# Patient Record
Sex: Female | Born: 1945 | Race: White | Hispanic: No | Marital: Married | Smoking: Former smoker
Health system: Southern US, Community
[De-identification: ages and names within clinical notes are randomized; demographics above are authoritative.]

## PROBLEM LIST (undated history)

## (undated) DIAGNOSIS — I10 Essential (primary) hypertension: Secondary | ICD-10-CM

## (undated) DIAGNOSIS — F0390 Unspecified dementia without behavioral disturbance: Secondary | ICD-10-CM

## (undated) HISTORY — PX: CATARACT EXTRACTION: SUR2

## (undated) HISTORY — PX: ABDOMINAL HYSTERECTOMY: SHX81

---

## 2000-09-03 ENCOUNTER — Emergency Department (HOSPITAL_COMMUNITY): Admission: EM | Admit: 2000-09-03 | Discharge: 2000-09-03 | Payer: Self-pay | Admitting: Internal Medicine

## 2000-09-03 ENCOUNTER — Encounter: Payer: Self-pay | Admitting: Internal Medicine

## 2001-01-30 ENCOUNTER — Encounter: Admission: RE | Admit: 2001-01-30 | Discharge: 2001-01-30 | Payer: Self-pay | Admitting: Family Medicine

## 2001-01-30 ENCOUNTER — Encounter: Payer: Self-pay | Admitting: Family Medicine

## 2001-02-11 ENCOUNTER — Encounter: Payer: Self-pay | Admitting: Family Medicine

## 2001-02-11 ENCOUNTER — Encounter: Admission: RE | Admit: 2001-02-11 | Discharge: 2001-02-11 | Payer: Self-pay | Admitting: Family Medicine

## 2002-02-26 ENCOUNTER — Ambulatory Visit (HOSPITAL_BASED_OUTPATIENT_CLINIC_OR_DEPARTMENT_OTHER): Admission: RE | Admit: 2002-02-26 | Discharge: 2002-02-26 | Payer: Self-pay | Admitting: Orthopedic Surgery

## 2003-04-20 ENCOUNTER — Other Ambulatory Visit: Admission: RE | Admit: 2003-04-20 | Discharge: 2003-04-20 | Payer: Self-pay | Admitting: Family Medicine

## 2003-12-16 ENCOUNTER — Ambulatory Visit (HOSPITAL_COMMUNITY): Admission: RE | Admit: 2003-12-16 | Discharge: 2003-12-16 | Payer: Self-pay | Admitting: *Deleted

## 2003-12-16 ENCOUNTER — Ambulatory Visit (HOSPITAL_BASED_OUTPATIENT_CLINIC_OR_DEPARTMENT_OTHER): Admission: RE | Admit: 2003-12-16 | Discharge: 2003-12-16 | Payer: Self-pay | Admitting: *Deleted

## 2005-12-01 ENCOUNTER — Other Ambulatory Visit: Admission: RE | Admit: 2005-12-01 | Discharge: 2005-12-01 | Payer: Self-pay | Admitting: Family Medicine

## 2006-02-23 ENCOUNTER — Ambulatory Visit (HOSPITAL_COMMUNITY): Admission: RE | Admit: 2006-02-23 | Discharge: 2006-02-23 | Payer: Self-pay | Admitting: Gastroenterology

## 2008-03-04 ENCOUNTER — Other Ambulatory Visit: Admission: RE | Admit: 2008-03-04 | Discharge: 2008-03-04 | Payer: Self-pay | Admitting: Family Medicine

## 2008-04-16 ENCOUNTER — Encounter: Admission: RE | Admit: 2008-04-16 | Discharge: 2008-04-16 | Payer: Self-pay | Admitting: Family Medicine

## 2011-03-31 NOTE — Op Note (Signed)
NAMEBILAN, Gilbert                      ACCOUNT NO.:  [id number]   MEDICAL RECORD NO.:  [id number]                   PATIENT TYPE:  AMB   LOCATION:  DSC                                  FACILITY:  [facility]   PHYSICIAN:  Lowell Bouton, M.D.      DATE OF BIRTH:  [birth year]   DATE OF PROCEDURE:  12/16/2003  DATE OF DISCHARGE:                                 OPERATIVE REPORT   PREOPERATIVE DIAGNOSIS:  Trigger finger right middle finger and left ring  finger.   POSTOPERATIVE DIAGNOSIS:  Trigger finger right middle finger and left ring  finger.   PROCEDURE:  Release of A1 pulley right middle finger and left ring finger.   SURGEON:  Lowell Bouton, M.D.   ANESTHESIA:  0.5% Marcaine local with sedation.   FINDINGS:  The patient had thickening of the tenosynovium around the tendon  in both digits. There was nodular formation beneath the A1 pulley.   DESCRIPTION OF PROCEDURE:  Under 0.5% Marcaine local anesthesia with a  tourniquet on both forearms, both hands were prepped and draped in the usual  sterile fashion and after elevating the limb, the right tourniquet was  inflated to 250 mmHg.  An oblique incision was made in line with the distal  palmar crease overlying the middle finger.  Blunt dissection was carried  through the subcutaneous tissues and bleeding points were coagulated.  Blunt  dissection was carried down to the flexor tendon sheath taking care to  protect the digital nerves.  The A1 pulley was incised with a knife and then  released completely with the scissors.  The finger was flexed fully and no  further locking or triggering was noted.  The wound was irrigated with  saline.  The skin was closed with 4-0 nylon sutures.  Sterile dressings were  applied and the tourniquet was released.  The left hand was then elevated  and it was noted that the tourniquet had been inflated at the time of the  opposite one.  It was released and then reinflated.   The incision was made  in line with the distal palmar crease overlying the ring finger.  There was  a fair amount of scar tissue in the adjacent digit where the previous middle  finger had been released.  Blunt dissection was carried down to the flexor  tendon sheath and some palmar fascia was excised.  The A1 pulley was  identified and care was taken to protect the digital nerves.  The A1 pulley  was incised with a knife and then released completely with the scissors.  The finger was fully flexed and no further locking or triggering was noted.  The wound was irrigated with saline and the skin was closed with 4-0 nylon  sutures.  Sterile dressings were applied and the tourniquet released with  good circulation to the hand. The patient went to the recovery room awake,  stable, and in good condition.  Lowell Bouton, M.D.    EMM/MEDQ  D:  12/16/2003  T:  12/16/2003  Job:  [id number]

## 2011-03-31 NOTE — Op Note (Signed)
[place]  Patient:    ROCIO, [name] Visit Number: [id number] MRN: [id number]          Service Type: DSU Location: [facility] [place] Attending Physician:  Marlowe Shores Dictated by:   Artist Pais Mina Marble, M.D. Proc. Date: 02/26/02 Admit Date:  02/26/2002                             Operative Report  PREOPERATIVE DIAGNOSIS: Left thumb and left long and ring finger stenosing tenosynovitis.  POSTOPERATIVE DIAGNOSIS: Left thumb and left long and ring finger stenosing tenosynovitis.  OPERATION/PROCEDURE:  1. Left thumb A1 pulley release.  2. Left long A1 pulley release.  3. Inject A1 pulled, left ring.  SURGEON: Artist Pais. Mina Marble, M.D.  ASSISTANT: Junius Roads. Ireton, P.A.C.  ANESTHESIA: Bier block.  TOURNIQUET TIME: Thirty minutes.  COMPLICATIONS: None.  DRAINS: None.  OPERATIVE REPORT: The patient was taken to the operating room and after induction of adequate Bier block analgesia the left upper extremity was prepped and draped in the usual sterile fashion.  Once this was done a 2 cm incision was made in the MP flexion crease of the thumb on the left.  The incision was taken down through the skin and subcutaneous tissue, with care to carefully identify and retract the neurovascular bundles.  The A1 pulley was identified and split, thus freeing up the FPL tendon.  The FPL tendon was lysed of all adhesions.  This wound was irrigated.  The same incision was made over the A1 pulley area of the long finger.  The same operation was performed. This wound was also irrigated.  These were both closed with running 3-0 Prolene subcuticular stitch under sterile conditions.  The ring finger A1 pulley area was injected with Kenalog-40 1 cc and 0.25% plain Marcaine 1 cc. The patient was then placed in a sterile dressing of Xeroform, 4 x 4s, fluffs, and a compressive hand dressing.  The patient tolerated the procedure well and went to the recovery  room in stable fashion. Dictated by:   Artist Pais Mina Marble, M.D. Attending Physician:  Marlowe Shores DD:  02/26/02 TD:  02/27/02 Job: [id number]

## 2011-03-31 NOTE — Op Note (Signed)
Gilbert, Cynthia               ACCOUNT NO.:  [id number]   MEDICAL RECORD NO.:  [id number]          PATIENT TYPE:  AMB   LOCATION:  ENDO                         FACILITY:  [facility]   PHYSICIAN:  Petra Kuba, M.D.    DATE OF BIRTH:  1946/07/21   DATE OF PROCEDURE:  02/23/2006  DATE OF DISCHARGE:  02/23/2006                                 OPERATIVE REPORT   PROCEDURE:  Capsule endoscopy.   INDICATIONS:  Guaiac positive anemia, nondiagnostic colon and endoscopy.   Consent was signed after risks, benefits, methods, options thoroughly  discussed with Cynthia Gilbert by Dr. Bosie Clos.   PROCEDURE:  The capsule endoscopy was done in the customary fashion on February 23, 2006 by the endoscopy staff, and Dr. Bosie Clos has asked me to review the  pictures and the report.   FINDINGS:  Entire normal capsule endoscopy without any signs of GI bleeding.   PLAN:  Further workup and plans per Dr. Bosie Clos with patient followup to  recheck CBCs, guaiacs and see if any further workup plans are needed.           ______________________________  Petra Kuba, M.D.     MEM/MEDQ  D:  03/14/2006  T:  03/14/2006  Job:  [id number]   cc:   Shirley Friar, MD  Fax: [phone]

## 2011-04-03 ENCOUNTER — Other Ambulatory Visit: Payer: Self-pay | Admitting: Family Medicine

## 2011-04-03 DIAGNOSIS — R0989 Other specified symptoms and signs involving the circulatory and respiratory systems: Secondary | ICD-10-CM

## 2011-04-04 ENCOUNTER — Ambulatory Visit
Admission: RE | Admit: 2011-04-04 | Discharge: 2011-04-04 | Disposition: A | Discharge status: Home / Self Care | Payer: Medicare Other | Source: Ambulatory Visit | Attending: Family Medicine | Admitting: Family Medicine

## 2011-04-04 DIAGNOSIS — R0989 Other specified symptoms and signs involving the circulatory and respiratory systems: Secondary | ICD-10-CM

## 2011-04-05 ENCOUNTER — Other Ambulatory Visit: Payer: Self-pay | Admitting: Family Medicine

## 2011-04-05 DIAGNOSIS — R4182 Altered mental status, unspecified: Secondary | ICD-10-CM

## 2011-04-06 ENCOUNTER — Other Ambulatory Visit: Payer: Medicare Other

## 2012-03-06 IMAGING — US US ABDOMEN COMPLETE
1 series · 14 of 25 positions shown · non-contrast
Comparison: None.

CLINICAL DATA: Abdominal bruit.

COMPLETE ABDOMINAL ULTRASOUND

[Series 1: us abdomen complete · 0.21mm/px · 14 of 63 slices shown]
[im 1/63]
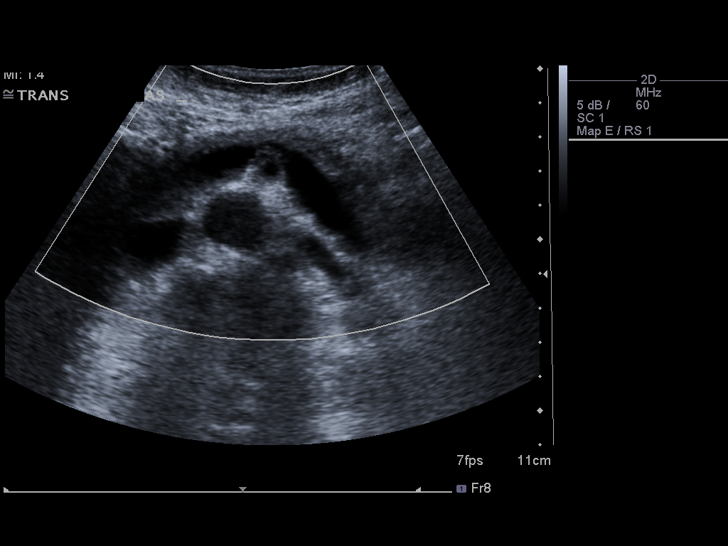
[im 6/63]
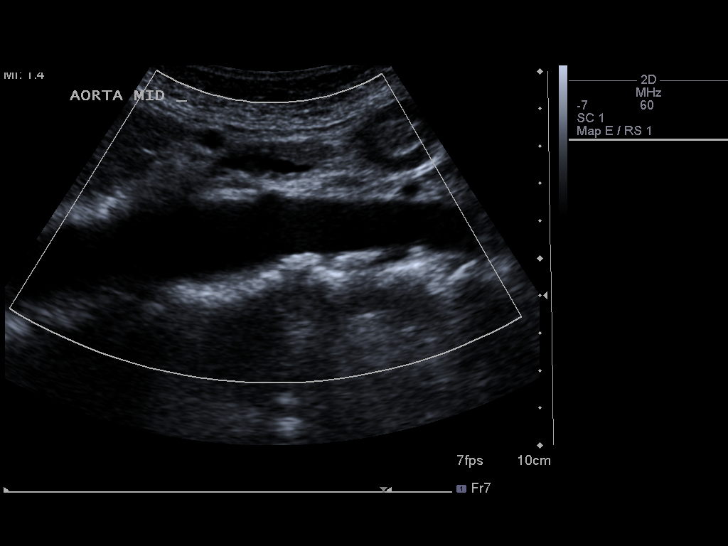
[im 11/63]
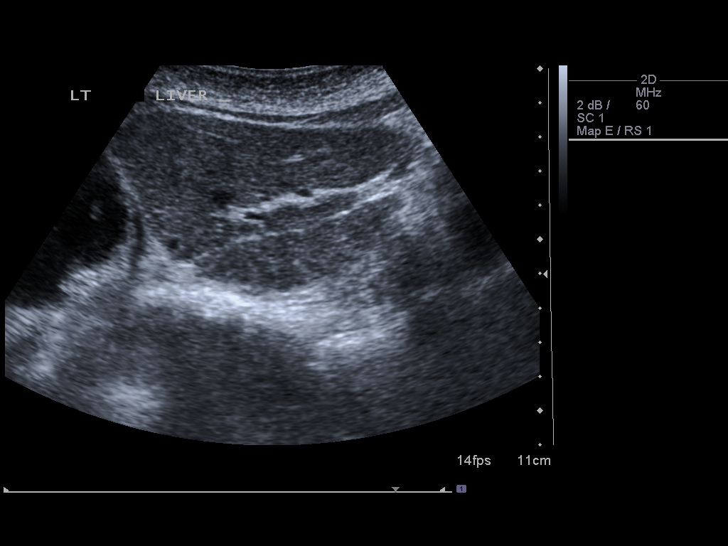
[im 16/63]
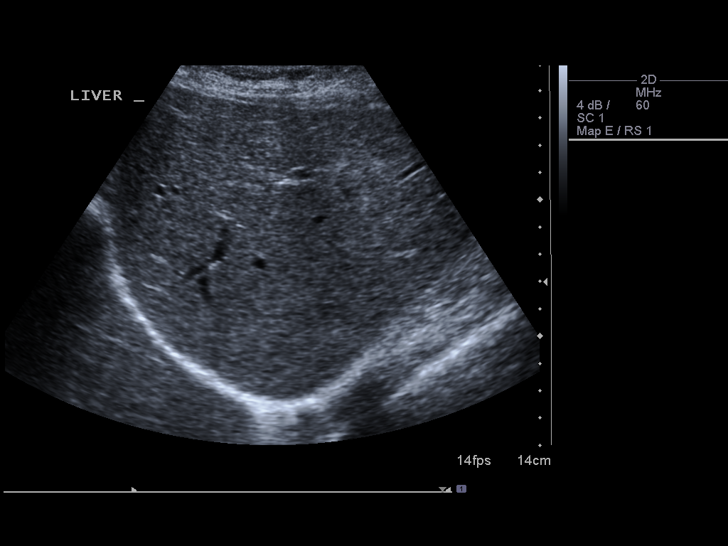
[im 21/63]
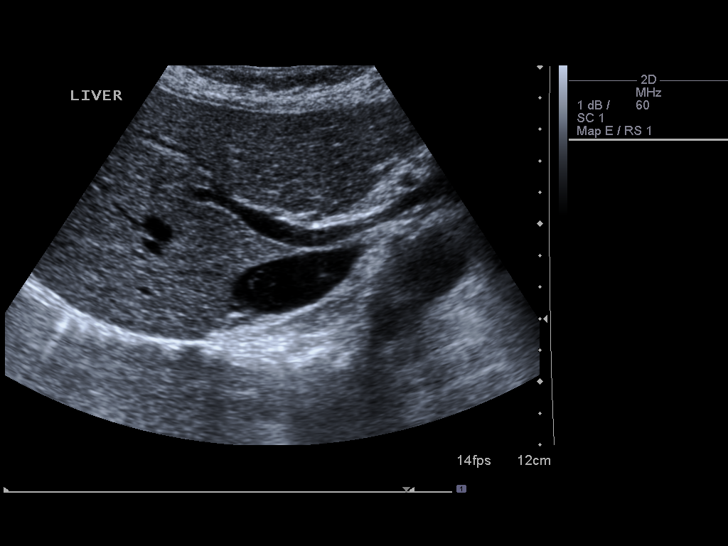
[im 24/63]
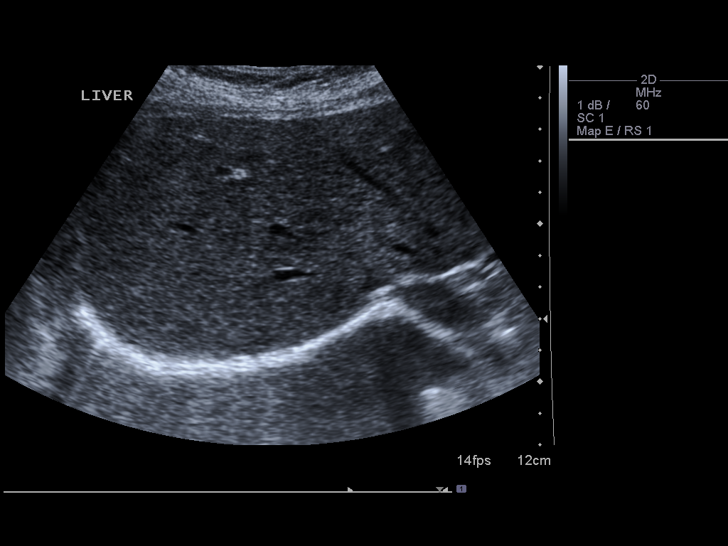
[im 29/63]
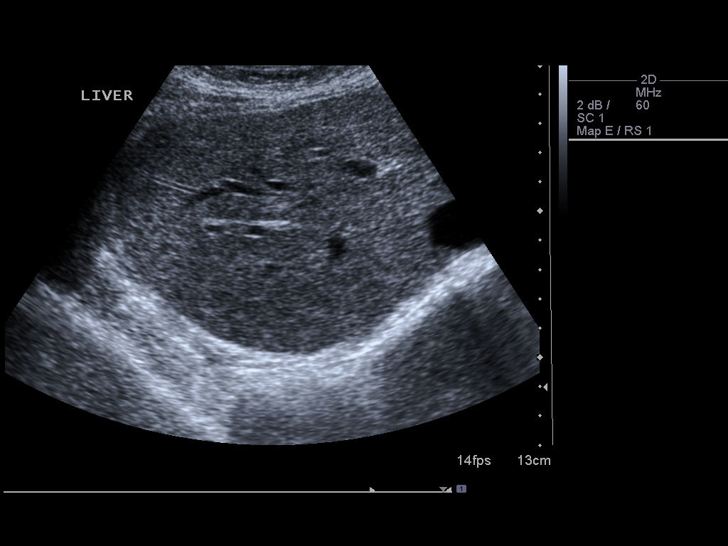
[im 34/63]
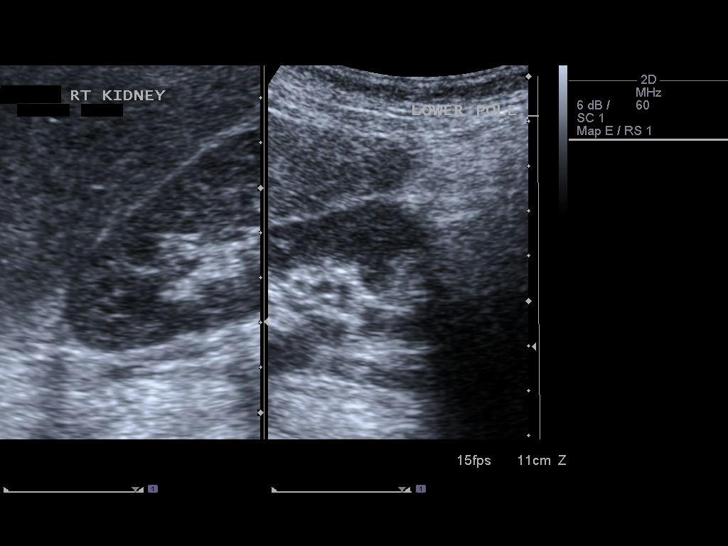
[im 39/63]
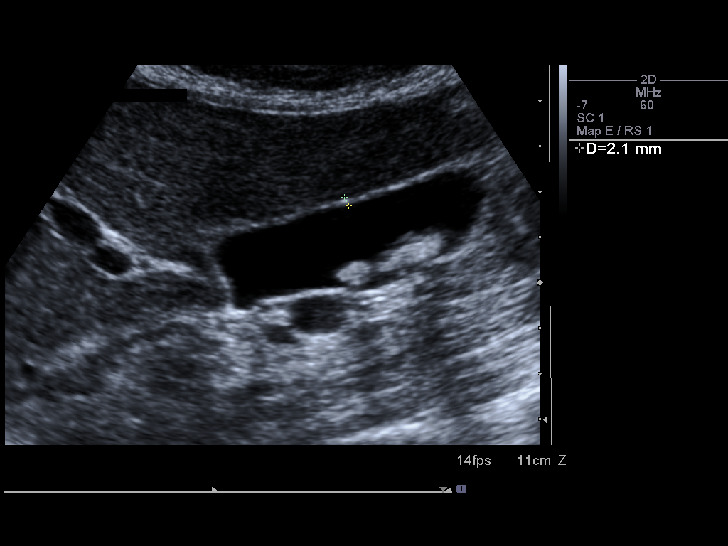
[im 42/63]
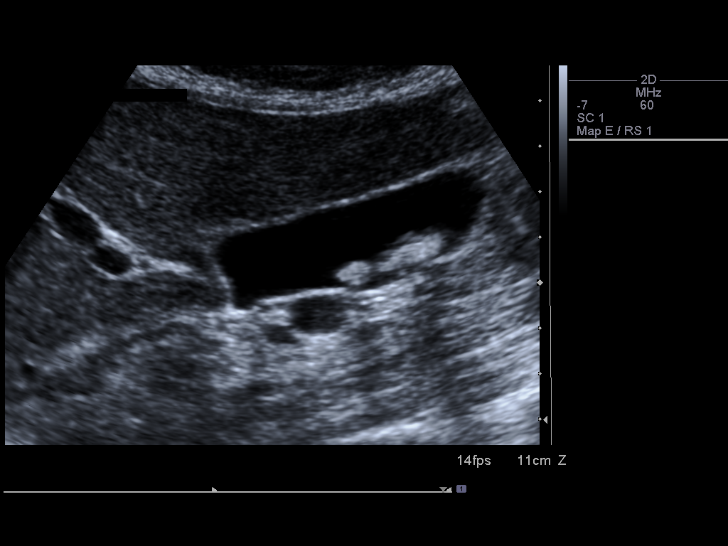
[im 47/63]
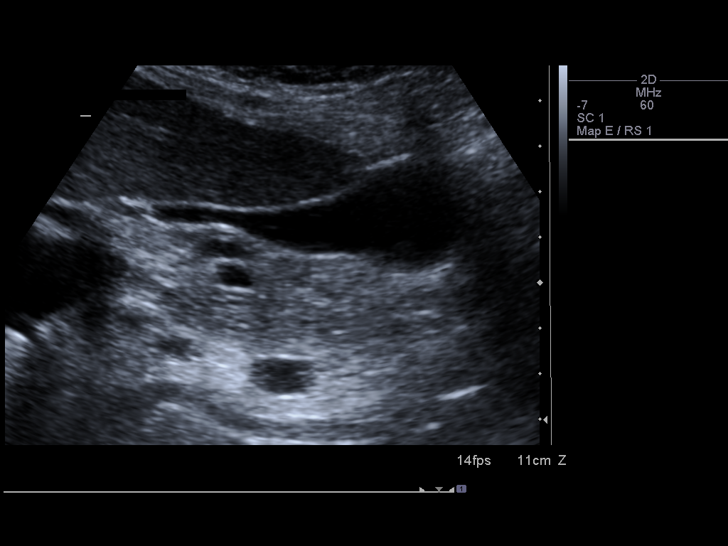
[im 52/63]
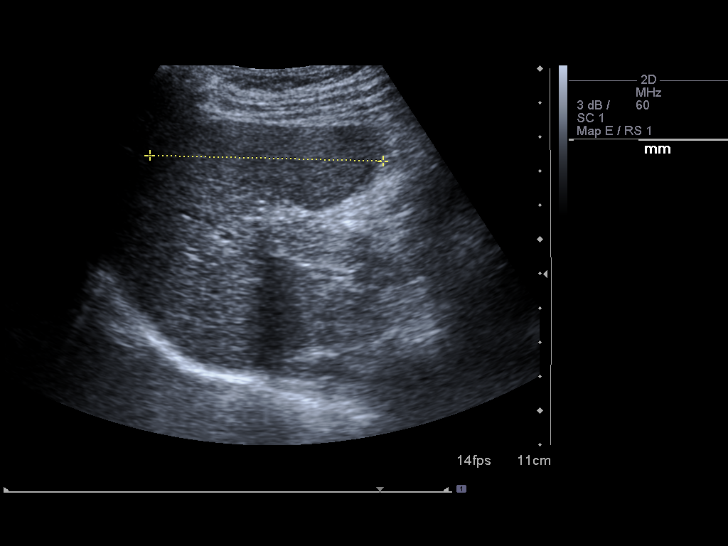
[im 57/63]
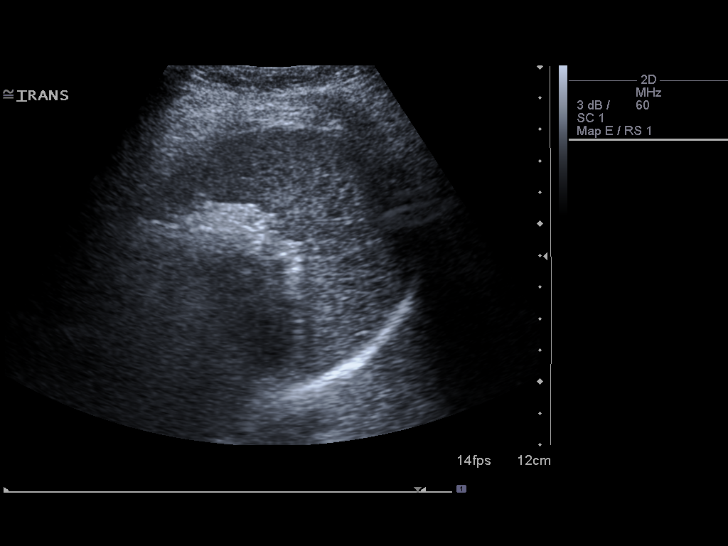
[im 63/63]
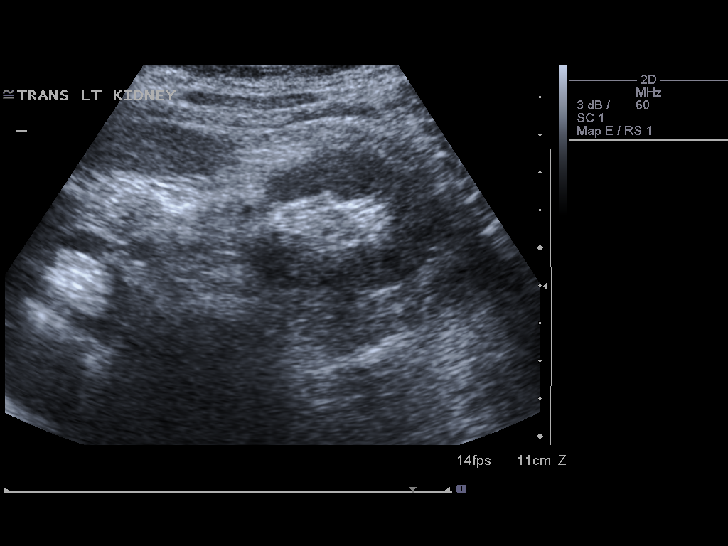

[14 of 25 positions shown; findings below may reference images not displayed]

FINDINGS: Gallbladder:  2 gallstones are seen within the gallbladder.  The
largest measures 1.6 cm.  No wall thickening or sonographic
Kai Cheong.

Common bile duct:   Normal caliber, 2 mm.

Liver:  No focal lesion identified.  Within normal limits in
parenchymal echogenicity.

IVC:  Appears normal.

Pancreas:  No focal abnormality seen.

Spleen:  Within normal limits in size and echotexture.

Right Kidney:   Normal in size and parenchymal echogenicity.  No
evidence of mass or hydronephrosis.

Left Kidney:  Normal in size and parenchymal echogenicity.  No
evidence of mass or hydronephrosis.

Abdominal aorta:  Maximum aortic diameter is 2.7 cm proximally,
tapering to 1.6 cm distally.  No evidence of aneurysm.  Mild
atherosclerotic irregularity.
IMPRESSION: Cholelithiasis.  No sonographic evidence of acute cholecystitis.

No evidence of abdominal aortic aneurysm.

## 2017-09-01 ENCOUNTER — Inpatient Hospital Stay (HOSPITAL_COMMUNITY): Payer: Medicare Other

## 2017-09-01 ENCOUNTER — Inpatient Hospital Stay (HOSPITAL_COMMUNITY)
Admission: AD | Admit: 2017-09-01 | Discharge: 2017-09-05 | DRG: 593 | Disposition: A | Discharge status: Hospice | Payer: Medicare Other | Attending: Internal Medicine | Admitting: Internal Medicine

## 2017-09-01 ENCOUNTER — Encounter (HOSPITAL_COMMUNITY): Payer: Self-pay | Admitting: Internal Medicine

## 2017-09-01 DIAGNOSIS — Z23 Encounter for immunization: Secondary | ICD-10-CM | POA: Diagnosis not present

## 2017-09-01 DIAGNOSIS — Z66 Do not resuscitate: Secondary | ICD-10-CM | POA: Diagnosis not present

## 2017-09-01 DIAGNOSIS — Z8249 Family history of ischemic heart disease and other diseases of the circulatory system: Secondary | ICD-10-CM | POA: Diagnosis not present

## 2017-09-01 DIAGNOSIS — Z515 Encounter for palliative care: Secondary | ICD-10-CM | POA: Diagnosis present

## 2017-09-01 DIAGNOSIS — I10 Essential (primary) hypertension: Secondary | ICD-10-CM | POA: Diagnosis present

## 2017-09-01 DIAGNOSIS — Z90710 Acquired absence of both cervix and uterus: Secondary | ICD-10-CM

## 2017-09-01 DIAGNOSIS — Z87891 Personal history of nicotine dependence: Secondary | ICD-10-CM | POA: Diagnosis not present

## 2017-09-01 DIAGNOSIS — F0390 Unspecified dementia without behavioral disturbance: Secondary | ICD-10-CM | POA: Diagnosis present

## 2017-09-01 DIAGNOSIS — L89620 Pressure ulcer of left heel, unstageable: Secondary | ICD-10-CM | POA: Diagnosis present

## 2017-09-01 DIAGNOSIS — L03116 Cellulitis of left lower limb: Secondary | ICD-10-CM | POA: Diagnosis present

## 2017-09-01 DIAGNOSIS — Z7401 Bed confinement status: Secondary | ICD-10-CM | POA: Diagnosis not present

## 2017-09-01 DIAGNOSIS — L0390 Cellulitis, unspecified: Secondary | ICD-10-CM

## 2017-09-01 DIAGNOSIS — R739 Hyperglycemia, unspecified: Secondary | ICD-10-CM | POA: Diagnosis not present

## 2017-09-01 DIAGNOSIS — L98499 Non-pressure chronic ulcer of skin of other sites with unspecified severity: Secondary | ICD-10-CM

## 2017-09-01 DIAGNOSIS — Z7189 Other specified counseling: Secondary | ICD-10-CM

## 2017-09-01 DIAGNOSIS — Z880 Allergy status to penicillin: Secondary | ICD-10-CM | POA: Diagnosis not present

## 2017-09-01 HISTORY — DX: Essential (primary) hypertension: I10

## 2017-09-01 HISTORY — DX: Unspecified dementia, unspecified severity, without behavioral disturbance, psychotic disturbance, mood disturbance, and anxiety: F03.90

## 2017-09-01 LAB — CBC
HEMATOCRIT: 45.3 % (ref 36.0–46.0)
Hemoglobin: 14.4 g/dL (ref 12.0–15.0)
MCH: 27.7 pg (ref 26.0–34.0)
MCHC: 31.8 g/dL (ref 30.0–36.0)
MCV: 87.1 fL (ref 78.0–100.0)
Platelets: 243 10*3/uL (ref 150–400)
RBC: 5.2 MIL/uL — ABNORMAL HIGH (ref 3.87–5.11)
RDW: 13.7 % (ref 11.5–15.5)
WBC: 9.2 10*3/uL (ref 4.0–10.5)

## 2017-09-01 LAB — COMPREHENSIVE METABOLIC PANEL
ALBUMIN: 3.7 g/dL (ref 3.5–5.0)
ALT: 21 U/L (ref 14–54)
AST: 22 U/L (ref 15–41)
Alkaline Phosphatase: 140 U/L — ABNORMAL HIGH (ref 38–126)
Anion gap: 11 (ref 5–15)
BILIRUBIN TOTAL: 0.5 mg/dL (ref 0.3–1.2)
BUN: 17 mg/dL (ref 6–20)
CHLORIDE: 110 mmol/L (ref 101–111)
CO2: 24 mmol/L (ref 22–32)
Calcium: 9.9 mg/dL (ref 8.9–10.3)
Creatinine, Ser: 0.94 mg/dL (ref 0.44–1.00)
GFR calc Af Amer: >60 mL/min (ref >60)
GFR calc non Af Amer: 60 mL/min — ABNORMAL LOW (ref >60)
GLUCOSE: 106 mg/dL — AB (ref 65–99)
POTASSIUM: 3.8 mmol/L (ref 3.5–5.1)
Sodium: 145 mmol/L (ref 135–145)
TOTAL PROTEIN: 7 g/dL (ref 6.5–8.1)

## 2017-09-01 MED ORDER — QUETIAPINE FUMARATE 25 MG PO TABS: 50.0000 mg | ORAL_TABLET | Freq: Every day | ORAL | Status: DC

## 2017-09-01 MED ORDER — SODIUM CHLORIDE 0.9% FLUSH: 3.0000 mL | INTRAVENOUS | Status: DC | PRN

## 2017-09-01 MED ORDER — SODIUM CHLORIDE 0.9 % IV SOLN: 250.0000 mL | INTRAVENOUS | Status: DC | PRN

## 2017-09-01 MED ORDER — SODIUM CHLORIDE 0.9% FLUSH
3.0000 mL | Freq: Two times a day (BID) | INTRAVENOUS | Status: DC
  Administered 2017-09-01 – 2017-09-04 (×4): 3 mL via INTRAVENOUS

## 2017-09-01 MED ORDER — VANCOMYCIN HCL 10 G IV SOLR
1250.0000 mg | INTRAVENOUS | Status: DC
  Administered 2017-09-01: 1250 mg via INTRAVENOUS
  Filled 2017-09-01: qty 1250

## 2017-09-01 MED ORDER — QUETIAPINE FUMARATE 25 MG PO TABS
50.0000 mg | ORAL_TABLET | Freq: Every day | ORAL | Status: DC
  Administered 2017-09-02 – 2017-09-05 (×4): 50 mg via ORAL
  Filled 2017-09-01 (×4): qty 2

## 2017-09-01 MED ORDER — ACETAMINOPHEN 325 MG PO TABS: 650.0000 mg | ORAL_TABLET | Freq: Four times a day (QID) | ORAL | Status: DC | PRN

## 2017-09-01 MED ORDER — LEVOFLOXACIN IN D5W 500 MG/100ML IV SOLN
500.0000 mg | INTRAVENOUS | Status: DC
  Administered 2017-09-01 – 2017-09-04 (×4): 500 mg via INTRAVENOUS
  Filled 2017-09-01 (×4): qty 100

## 2017-09-01 MED ORDER — ENOXAPARIN SODIUM 40 MG/0.4ML ~~LOC~~ SOLN
40.0000 mg | SUBCUTANEOUS | Status: DC
  Administered 2017-09-02 – 2017-09-05 (×4): 40 mg via SUBCUTANEOUS
  Filled 2017-09-01 (×4): qty 0.4

## 2017-09-01 MED ORDER — QUETIAPINE FUMARATE 25 MG PO TABS
75.0000 mg | ORAL_TABLET | Freq: Every day | ORAL | Status: DC
  Administered 2017-09-02 – 2017-09-04 (×4): 75 mg via ORAL
  Filled 2017-09-01 (×4): qty 3

## 2017-09-01 MED ORDER — MEMANTINE HCL 10 MG PO TABS
5.0000 mg | ORAL_TABLET | Freq: Two times a day (BID) | ORAL | Status: DC
  Administered 2017-09-02 – 2017-09-05 (×8): 5 mg via ORAL
  Filled 2017-09-01 (×8): qty 1

## 2017-09-01 MED ORDER — SILVER SULFADIAZINE 1 % EX CREA
TOPICAL_CREAM | Freq: Every day | CUTANEOUS | Status: DC
  Administered 2017-09-01 – 2017-09-04 (×4): via TOPICAL
  Administered 2017-09-05: 1 via TOPICAL
  Filled 2017-09-01: qty 85

## 2017-09-01 MED ORDER — LOSARTAN POTASSIUM 50 MG PO TABS
25.0000 mg | ORAL_TABLET | Freq: Every day | ORAL | Status: DC
  Administered 2017-09-02 – 2017-09-05 (×4): 25 mg via ORAL
  Filled 2017-09-01 (×4): qty 1

## 2017-09-01 MED ORDER — ACETAMINOPHEN 650 MG RE SUPP
650.0000 mg | Freq: Four times a day (QID) | RECTAL | Status: DC | PRN
  Administered 2017-09-01: 650 mg via RECTAL
  Filled 2017-09-01: qty 1

## 2017-09-01 MED ORDER — QUETIAPINE FUMARATE 25 MG PO TABS
50.0000 mg | ORAL_TABLET | Freq: Every day | ORAL | Status: DC
  Administered 2017-09-02 – 2017-09-05 (×4): 50 mg via ORAL
  Filled 2017-09-01 (×5): qty 2

## 2017-09-01 MED ORDER — INFLUENZA VAC SPLIT HIGH-DOSE 0.5 ML IM SUSY
0.5000 mL | PREFILLED_SYRINGE | INTRAMUSCULAR | Status: AC
  Administered 2017-09-02: 0.5 mL via INTRAMUSCULAR
  Filled 2017-09-01: qty 0.5

## 2017-09-01 NOTE — Progress Notes (Signed)
Pt admitted to floor 1840. MD at bedside. No orders yet. But MD to put in orders. Per family patient is mainly nonverbal due to advanced dementia. MD at desk placing orders and writing note at this time

## 2017-09-01 NOTE — Progress Notes (Signed)
Pharmacy Antibiotic Note  Cynthia RhymesLeslie J Sugg is a 71 y.o. female admitted on 09/01/2017 with heel infection.  Pharmacy has been consulted for Vancomycin / Levaquin dosing.  With advanced dementia, Scr stable  Plan: Levaquin 500 mg iv Q 24 hours Vancomycin 1250 mg iv Q 24  Height: 5\' 5"  (165.1 cm) Weight: 151 lb 0.2 oz (68.5 kg) IBW/kg (Calculated) : 57  No data recorded.   Recent Labs Lab 09/01/17 1918  WBC 9.2  CREATININE 0.94    Estimated Creatinine Clearance: 53.4 mL/min (by C-G formula based on SCr of 0.94 mg/dL).    Allergies  Allergen Reactions  . Amoxicillin Anaphylaxis     Thank you for allowing pharmacy to be a part of this patient's care. Okey RegalLisa Sowmya Partridge, PharmD [phone] 09/01/2017 8:23 PM

## 2017-09-01 NOTE — H&P (Signed)
H&P   Patient Demographics:    Cynthia Gilbert, is a 71 y.o. female  MRN: [id number]   DOB - 1946-01-26  Admit Date - 09/01/2017  Outpatient Primary MD for the patient is No primary care provider on file.  Referring MD/NP/PA:   Outpatient Specialists:   Patient coming from: home  No chief complaint on file. skin ulcer, cellulitis    HPI:    Cynthia Gilbert  is a 71 y.o. female, w end stage dementia apparently has skin ulcer on the left heel, husband apparently noticed this the other day and she was started on bactrim by her pcp.  She was unable to take the medicine due to her dementia and there was a foul smelling odor coming from her heel today.  There was redness extending from the skin ulcer up her heel towards her ankle.  Pt denies fever, chills, cp, palp, cough, sob.  There is slight swelling of the left foot in comparison to the right.  Pt will be admitted for skin ulcer and cellulitis.      Review of systems:    In addition to the HPI above,  No Fever-chills, No Headache, No changes with Vision or hearing, No problems swallowing food or Liquids, No Chest pain, Cough or Shortness of Breath, No Abdominal pain, No Nausea or Vommitting, Bowel movements are regular, No Blood in stool or Urine, No dysuria, No new joints pains-aches,  No new weakness, tingling, numbness in any extremity, No recent weight gain or loss, No polyuria, polydypsia or polyphagia, No significant Mental Stressors.  A full 10 point Review of Systems was done, except as stated above, all other Review of Systems were negative.   With Past History of the following :    Past Medical History:  Diagnosis Date  . Dementia   . Hypertension       Past Surgical History:  Procedure Laterality Date  . ABDOMINAL HYSTERECTOMY    . CATARACT EXTRACTION        Social History:     Social History   Substance Use Topics  . Smoking status: Former Smoker    Packs/day: 1.00    Years: 25.00    Types: Cigarettes    Quit date: 11/13/2008  . Smokeless tobacco: Never Used  . Alcohol use No     Lives - at home  Mobility -  Walks w assistance   Family History :     Family History  Problem Relation Age of Onset  . COPD Mother   . Heart failure Father       Home Medications:   Prior to Admission medications   Not on File     Allergies:     Allergies  Allergen Reactions  . Amoxicillin Anaphylaxis     Physical Exam:   Vitals  There were no vitals taken for this visit.   1. General  lying in bed in NAD,    2.  Normal affect and insight, Not Suicidal or Homicidal, Awake Alert, Oriented X 1.  3. No F.N deficits, ALL C.Nerves Intact, Strength 5/5 all 4 extremities, Sensation intact all 4 extremities, Plantars down going.  4. Ears and Eyes appear Normal, Conjunctivae clear, PERRLA. Moist Oral Mucosa.  5. Supple Neck, No JVD, No cervical lymphadenopathy appriciated, No Carotid Bruits.  6. Symmetrical Chest wall movement, Good air movement bilaterally, CTAB.  7. RRR, No Gallops, Rubs or Murmurs, No Parasternal Heave.  8. Positive Bowel Sounds, Abdomen Soft, No tenderness, No organomegaly appriciated,No rebound -guarding or rigidity.  9.  No Cyanosis, slight swelling of the left foot 2.5x3cm oval skin ulcer on the bottom to medial aspect of the left heel, slight surround redness, and erythema extending toward ankle.  Slight redness over the dorsum of the left foot.   10. Good muscle tone,  joints appear normal , no effusions, Normal ROM.  11. No Palpable Lymph Nodes in Neck or Axillae     Data Review:    CBC No results for input(s): WBC, HGB, HCT, PLT, MCV, MCH, MCHC, RDW, LYMPHSABS, MONOABS, EOSABS, BASOSABS, BANDABS in the last 168 hours.  Invalid input(s): NEUTRABS,  BANDSABD ------------------------------------------------------------------------------------------------------------------  Chemistries  No results for input(s): NA, K, CL, CO2, GLUCOSE, BUN, CREATININE, CALCIUM, MG, AST, ALT, ALKPHOS, BILITOT in the last 168 hours.  Invalid input(s): GFRCGP ------------------------------------------------------------------------------------------------------------------ CrCl cannot be calculated (No order found.). ------------------------------------------------------------------------------------------------------------------ No results for input(s): TSH, T4TOTAL, T3FREE, THYROIDAB in the last 72 hours.  Invalid input(s): FREET3  Coagulation profile No results for input(s): INR, PROTIME in the last 168 hours. ------------------------------------------------------------------------------------------------------------------- No results for input(s): DDIMER in the last 72 hours. -------------------------------------------------------------------------------------------------------------------  Cardiac Enzymes No results for input(s): CKMB, TROPONINI, MYOGLOBIN in the last 168 hours.  Invalid input(s): CK ------------------------------------------------------------------------------------------------------------------ No results found for: BNP   ---------------------------------------------------------------------------------------------------------------  Urinalysis No results found for: COLORURINE, APPEARANCEUR, LABSPEC, [place], GLUCOSEU, HGBUR, BILIRUBINUR, KETONESUR, PROTEINUR, UROBILINOGEN, NITRITE, LEUKOCYTESUR  ----------------------------------------------------------------------------------------------------------------   Imaging Results:    No results found.    Assessment & Plan:    Principal Problem:   Skin ulcer ([place]) Active Problems:   Dementia   Cellulitis    Skin ulcer/ celluilitis Wound culture Wound care  consult DC Bactrim Start empiric levaquin iv , and vanco iv pharmacy to dose Check cbc, cmp, esr, blood culture x2 Check left heel xray  Dementia Cont namenda  Hypertension Cont losartan  DVT Prophylaxis Lovenox - SCDs   AM Labs Ordered, also please review Full Orders  Family Communication: Admission, patients condition and plan of care including tests being ordered have been discussed with the patient  who indicate understanding and agree with the plan and Code Status.  Code Status FULL CODE  Likely DC to  home  Condition GUARDED    Consults called: none  Admission status: inpatient   Time spent in minutes : 45   Jani Gravel M.D on 09/01/2017 at 6:41 PM  Between 7am to 7am - Pager - [phone].

## 2017-09-02 DIAGNOSIS — R739 Hyperglycemia, unspecified: Secondary | ICD-10-CM

## 2017-09-02 DIAGNOSIS — I10 Essential (primary) hypertension: Secondary | ICD-10-CM

## 2017-09-02 LAB — SEDIMENTATION RATE: SED RATE: 17 mm/h (ref 0–22)

## 2017-09-02 LAB — GAMMA GT: GGT: 23 U/L (ref 7–50)

## 2017-09-02 MED ORDER — HYDRALAZINE HCL 20 MG/ML IJ SOLN: 5.0000 mg | Freq: Four times a day (QID) | INTRAMUSCULAR | Status: DC | PRN

## 2017-09-02 MED ORDER — DIPHENHYDRAMINE HCL 50 MG/ML IJ SOLN
25.0000 mg | Freq: Four times a day (QID) | INTRAMUSCULAR | Status: DC | PRN
  Administered 2017-09-02: 25 mg via INTRAVENOUS
  Filled 2017-09-02: qty 1

## 2017-09-02 MED ORDER — VANCOMYCIN HCL 10 G IV SOLR: 1250.0000 mg | INTRAVENOUS | Status: DC

## 2017-09-02 NOTE — Progress Notes (Signed)
MD notified pt had increasing redness in face, back extremely red and hot to touch with patient scratching abdomen. This is a change from initial assessment.  Patient did have first dose of vancomycin this evening.  MD ordered this nurse to stop vancomycin, and give benadryl IV.  Since administration of benadryl, the redness on her face has subsided, and her back has turned from bright red to pink in color.  Pharmacy made aware of occurrence.  Will continue to monitor patient.

## 2017-09-02 NOTE — Progress Notes (Signed)
Patient ID: Cynthia Gilbert, female   DOB: 09-Sep-1946, 71 y.o.   MRN: [id number]                                                                PROGRESS NOTE                                                                                                                                                                                                             Patient Demographics:    Cynthia Gilbert, is a 71 y.o. female, DOB - 1946/05/10, JYN:[id number]  Admit date - 09/01/2017   Admitting Physician Pearson Grippe, MD  Outpatient Primary MD for the patient is No primary care provider on file.  LOS - 1  Outpatient Specialists:    No chief complaint on file.   Cellulitis, skin ulcer   Brief Narrative   71 y.o. female, w end stage dementia apparently has skin ulcer on the left heel, husband apparently noticed this the other day and she was started on bactrim by her pcp.  She was unable to take the medicine due to her dementia and there was a foul smelling odor coming from her heel today.  There was redness extending from the skin ulcer up her heel towards her ankle.  Pt denies fever, chills, cp, palp, cough, sob.  There is slight swelling of the left foot in comparison to the right.  Pt will be admitted for skin ulcer and cellulitis.     Subjective:    Cynthia Gilbert today is lying in bed, no complaints  Skin ulcer is still red and angry.  Pt had allergic reaction to vanco last nite, ? Red man syndrome.   No headache, No chest pain, No abdominal pain - No Nausea, No new weakness tingling or numbness, No Cough - SOB.    Assessment  & Plan :    Principal Problem:   Skin ulcer ([facility]) Active Problems:   Dementia   Cellulitis   Hyperglycemia   Essential hypertension    Skin ulcer/ celluilitis Xray=> negative osteomyelitis Blood culture => ngtd Wound culture=> gram stain few gram positive cocci in pairs Wound care consult DC Bactrim 10/20 Levaquin iv 10/20=> vanco 10/20=> ? Red man  syndrome, STOPPED   Dementia Cont namenda  Hypertension Cont losartan Add  hydralazine 5mg  iv q6h prn sbp >160  Code Status :  FULL CODE  Family Communication  : w daughter  Disposition Plan  : TBD  Barriers For Discharge :   Consults  :  Wound care  Procedures  :   DVT Prophylaxis  :  Lovenox - SCDs   Lab Results  Component Value Date   PLT 243 09/01/2017    Antibiotics  :  vanco 10/20, levaquin 10/20=>   Anti-infectives    Start     Dose/Rate Route Frequency Ordered Stop   09/01/17 2100  levofloxacin (LEVAQUIN) IVPB 500 mg     500 mg 100 mL/hr over 60 Minutes Intravenous Every 24 hours 09/01/17 2026     09/01/17 2100  vancomycin (VANCOCIN) 1,250 mg in sodium chloride 0.9 % 250 mL IVPB     1,250 mg 166.7 mL/hr over 90 Minutes Intravenous Every 24 hours 09/01/17 2026          Objective:   Vitals:   09/01/17 1900 09/01/17 2214 09/02/17 0500  BP:  138/69 (!) 153/75  Pulse:  80 72  Resp:  18 18  Temp:  98.2 F (36.8 C) (!) 97.5 F (36.4 C)  TempSrc:  Oral Oral  SpO2:  97% 97%  Weight: 68.5 kg (151 lb 0.2 oz)  69.4 kg (153 lb)  Height: 5\' 5"  (1.651 m)      Wt Readings from Last 3 Encounters:  09/02/17 69.4 kg (153 lb)    No intake or output data in the 24 hours ending 09/02/17 0731   Physical Exam  Awake Alert, Oriented X 3, No new F.N deficits, Normal affect [place].AT,PERRAL Supple Neck,No JVD, No cervical lymphadenopathy appriciated.  Symmetrical Chest wall movement, Good air movement bilaterally, CTAB RRR,No Gallops,Rubs or new Murmurs, No Parasternal Heave +ve B.Sounds, Abd Soft, No tenderness, No organomegaly appriciated, No rebound - guarding or rigidity. No Cyanosis, Clubbing   slight swelling of the left foot 2.5x3cm oval skin ulcer on the bottom to medial aspect of the left heel, slight surround redness, and erythema extending toward ankle.  Slight redness over the dorsum of the left foot.    Data Review:    CBC  Recent Labs Lab  09/01/17 1918  WBC 9.2  HGB 14.4  HCT 45.3  PLT 243  MCV 87.1  MCH 27.7  MCHC 31.8  RDW 13.7    Chemistries   Recent Labs Lab 09/01/17 1918  NA 145  K 3.8  CL 110  CO2 24  GLUCOSE 106*  BUN 17  CREATININE 0.94  CALCIUM 9.9  AST 22  ALT 21  ALKPHOS 140*  BILITOT 0.5   ------------------------------------------------------------------------------------------------------------------ No results for input(s): CHOL, HDL, LDLCALC, TRIG, CHOLHDL, LDLDIRECT in the last 72 hours.  No results found for: HGBA1C ------------------------------------------------------------------------------------------------------------------ No results for input(s): TSH, T4TOTAL, T3FREE, THYROIDAB in the last 72 hours.  Invalid input(s): FREET3 ------------------------------------------------------------------------------------------------------------------ No results for input(s): VITAMINB12, FOLATE, FERRITIN, TIBC, IRON, RETICCTPCT in the last 72 hours.  Coagulation profile No results for input(s): INR, PROTIME in the last 168 hours.  No results for input(s): DDIMER in the last 72 hours.  Cardiac Enzymes No results for input(s): CKMB, TROPONINI, MYOGLOBIN in the last 168 hours.  Invalid input(s): CK ------------------------------------------------------------------------------------------------------------------ No results found for: BNP  Inpatient Medications  Scheduled Meds: . enoxaparin (LOVENOX) injection  40 mg Subcutaneous Q24H  . Influenza vac split quadrivalent PF  0.5 mL Intramuscular Tomorrow-1000  . losartan  25 mg Oral Daily  . memantine  5 mg Oral BID  . QUEtiapine  50 mg Oral Q1200  . QUEtiapine  50 mg Oral Daily  . QUEtiapine  75 mg Oral QHS  . silver sulfADIAZINE   Topical Daily  . sodium chloride flush  3 mL Intravenous Q12H   Continuous Infusions: . sodium chloride    . levofloxacin (LEVAQUIN) IV Stopped (09/01/17 2208)  . vancomycin Stopped (09/01/17 2322)    PRN Meds:.sodium chloride, acetaminophen **OR** acetaminophen, diphenhydrAMINE, sodium chloride flush  Micro Results Recent Results (from the past 240 hour(s))  Aerobic Culture (superficial specimen)     Status: None (Preliminary result)   Collection Time: 09/02/17 12:38 AM  Result Value Ref Range Status   Specimen Description WOUND LEFT HEEL  Final   Special Requests NONE  Final   Gram Stain   Final    DEGENERATED CELLULAR MATERIAL PRESENT FEW GRAM POSITIVE COCCI IN PAIRS IN SINGLES RARE GRAM POSITIVE RODS    Culture PENDING  Incomplete   Report Status PENDING  Incomplete    Radiology Reports Dg Ankle 2 Views Left  Result Date: 09/01/2017 CLINICAL DATA:  Skin ulcer on the left medial heel extending towards the ankle. EXAM: LEFT ANKLE - 2 VIEW COMPARISON:  None. FINDINGS: Diffuse soft tissue swelling over the left ankle. No evidence of acute fracture or dislocation. No focal bone lesion, bone destruction, bone sclerosis, or cortical changes. No radiopaque soft tissue foreign bodies. Overlying extrinsic structures may obscure small focal changes. IMPRESSION: Soft tissue swelling about the left ankle. No acute bony abnormalities. No radiographic changes of osteomyelitis. Electronically Signed   By: Burman Nieves M.D.   On: 09/01/2017 22:52    Time Spent in minutes  30   Pearson Grippe M.D on 09/02/2017 at 7:31 AM  Between 7am to 7am - Pager - [phone]

## 2017-09-03 LAB — CBC
HEMATOCRIT: 42.7 % (ref 36.0–46.0)
HEMOGLOBIN: 14 g/dL (ref 12.0–15.0)
MCH: 27.6 pg (ref 26.0–34.0)
MCHC: 32.8 g/dL (ref 30.0–36.0)
MCV: 84.2 fL (ref 78.0–100.0)
Platelets: 186 10*3/uL (ref 150–400)
RBC: 5.07 MIL/uL (ref 3.87–5.11)
RDW: 13.3 % (ref 11.5–15.5)
WBC: 8 10*3/uL (ref 4.0–10.5)

## 2017-09-03 LAB — COMPREHENSIVE METABOLIC PANEL
ALBUMIN: 3.2 g/dL — AB (ref 3.5–5.0)
ALK PHOS: 122 U/L (ref 38–126)
ALT: 16 U/L (ref 14–54)
ANION GAP: 7 (ref 5–15)
AST: 20 U/L (ref 15–41)
BILIRUBIN TOTAL: 0.7 mg/dL (ref 0.3–1.2)
BUN: 12 mg/dL (ref 6–20)
CALCIUM: 9.2 mg/dL (ref 8.9–10.3)
CO2: 21 mmol/L — AB (ref 22–32)
CREATININE: 0.81 mg/dL (ref 0.44–1.00)
Chloride: 111 mmol/L (ref 101–111)
GFR calc Af Amer: >60 mL/min (ref >60)
GFR calc non Af Amer: >60 mL/min (ref >60)
GLUCOSE: 92 mg/dL (ref 65–99)
Potassium: 3.7 mmol/L (ref 3.5–5.1)
SODIUM: 139 mmol/L (ref 135–145)
TOTAL PROTEIN: 6.2 g/dL — AB (ref 6.5–8.1)

## 2017-09-03 LAB — BLOOD CULTURE ID PANEL (REFLEXED)
Acinetobacter baumannii: NOT DETECTED
CANDIDA ALBICANS: NOT DETECTED
CANDIDA PARAPSILOSIS: NOT DETECTED
CANDIDA TROPICALIS: NOT DETECTED
Candida glabrata: NOT DETECTED
Candida krusei: NOT DETECTED
ENTEROBACTERIACEAE SPECIES: NOT DETECTED
Enterobacter cloacae complex: NOT DETECTED
Enterococcus species: NOT DETECTED
Escherichia coli: NOT DETECTED
HAEMOPHILUS INFLUENZAE: NOT DETECTED
KLEBSIELLA OXYTOCA: NOT DETECTED
Klebsiella pneumoniae: NOT DETECTED
Listeria monocytogenes: NOT DETECTED
METHICILLIN RESISTANCE: NOT DETECTED
NEISSERIA MENINGITIDIS: NOT DETECTED
PROTEUS SPECIES: NOT DETECTED
Pseudomonas aeruginosa: NOT DETECTED
SERRATIA MARCESCENS: NOT DETECTED
STAPHYLOCOCCUS SPECIES: DETECTED — AB
STREPTOCOCCUS SPECIES: NOT DETECTED
Staphylococcus aureus (BCID): NOT DETECTED
Streptococcus agalactiae: NOT DETECTED
Streptococcus pneumoniae: NOT DETECTED
Streptococcus pyogenes: NOT DETECTED

## 2017-09-03 LAB — VITAMIN D 25 HYDROXY (VIT D DEFICIENCY, FRACTURES): VIT D 25 HYDROXY: 53.1 ng/mL (ref 30.0–100.0)

## 2017-09-03 MED ORDER — ERYTHROMYCIN 5 MG/GM OP OINT
TOPICAL_OINTMENT | Freq: Three times a day (TID) | OPHTHALMIC | Status: DC
  Administered 2017-09-03 (×2): via OPHTHALMIC
  Administered 2017-09-04: 1 via OPHTHALMIC
  Administered 2017-09-04 (×2): via OPHTHALMIC
  Administered 2017-09-05: 1 via OPHTHALMIC
  Filled 2017-09-03: qty 3.5

## 2017-09-03 NOTE — Progress Notes (Signed)
Patient ID: Cynthia Gilbert, female   DOB: 10/21/1946, 71 y.o.   MRN: [id number]                                                                PROGRESS NOTE                                                                                                                                                                                                             Patient Demographics:    Cynthia Gilbert, is a 71 y.o. female, DOB - 10/13/1946, QMV:[id number]  Admit date - 09/01/2017   Admitting Physician Pearson Grippe, MD  Outpatient Primary MD for the patient is No primary care provider on file.  LOS - 2  Outpatient Specialists:  No chief complaint on file.     Skin ulcer,  Cellulitis  Brief Narrative  71 y.o.female,w end stage dementia apparently has skin ulcer on the left heel, husband apparently noticed this the other day and she was started on bactrim by her pcp. She was unable to take the medicine due to her dementia and there was a foul smelling odor coming from her heel today. There was redness extending from the skin ulcer up her heel towards her ankle. Pt denies fever, chills, cp, palp, cough, sob. There is slight swelling of the left foot in comparison to the right. Pt will be admitted for skin ulcer and cellulitis.     Subjective:    Cynthia Gilbert today has been afebrile  Lying in bed.  Skin ulcer has less smell.    No headache, No chest pain, No abdominal pain - No Nausea, No new weakness tingling or numbness, No Cough - SOB.    Assessment  & Plan :    Principal Problem:   Skin ulcer (HCC) Active Problems:   Dementia   Cellulitis   Hyperglycemia   Essential hypertension   Skin ulcer/ celluilitis Xray=> negative osteomyelitis Blood culture => ngtd Wound culture=> gram stain few gram positive cocci in pairs Wound care consult DC Bactrim 10/20 Levaquin iv 10/20=> vanco 10/20=> ? Red man syndrome, STOPPED   Dementia Cont namenda Palliative care consult to  discuss her code status with husband  Hypertension Cont losartan Cont hydralazine 5mg  iv q6h prn sbp >160   Code Status :  FULL CODE  Family Communication  : w daughter  Disposition Plan  : TBD  Barriers For Discharge :   Consults  :  Wound care  Procedures  :   DVT Prophylaxis  :  Lovenox - SCDs   Antibiotics  :  vanco 10/20, levaquin 10/20=>     Anti-infectives    Start     Dose/Rate Route Frequency Ordered Stop   09/02/17 2100  vancomycin (VANCOCIN) 1,250 mg in sodium chloride 0.9 % 250 mL IVPB  Status:  Discontinued     1,250 mg 83.3 mL/hr over 180 Minutes Intravenous Every 24 hours 09/02/17 0940 09/02/17 1017   09/01/17 2100  levofloxacin (LEVAQUIN) IVPB 500 mg     500 mg 100 mL/hr over 60 Minutes Intravenous Every 24 hours 09/01/17 2026     09/01/17 2100  vancomycin (VANCOCIN) 1,250 mg in sodium chloride 0.9 % 250 mL IVPB  Status:  Discontinued     1,250 mg 166.7 mL/hr over 90 Minutes Intravenous Every 24 hours 09/01/17 2026 09/02/17 0940        Objective:   Vitals:   09/02/17 0549 09/02/17 1315 09/02/17 2209 09/03/17 0625  BP: (!) 153/75 (!) 139/59 (!) 167/77 (!) 155/75  Pulse: 72 76 75 76  Resp: 18 17 16 16   Temp: (!) 97.5 F (36.4 C) 98.6 F (37 C) 98.4 F (36.9 C) 98 F (36.7 C)  TempSrc: Oral Oral Oral Oral  SpO2: 97% 98% 98% 98%  Weight:    69.7 kg (153 lb 10.6 oz)  Height:        Wt Readings from Last 3 Encounters:  09/03/17 69.7 kg (153 lb 10.6 oz)     Intake/Output Summary (Last 24 hours) at 09/03/17 0951 Last data filed at 09/03/17 0851  Gross per 24 hour  Intake              240 ml  Output              700 ml  Net             -460 ml     Physical Exam  Awake Alert, Oriented X 1, No new F.N deficits, Normal affect Williams.AT,PERRAL Supple Neck,No JVD, No cervical lymphadenopathy appriciated.  Symmetrical Chest wall movement, Good air movement bilaterally, CTAB RRR,No Gallops,Rubs or new Murmurs, No Parasternal Heave +ve  B.Sounds, Abd Soft, No tenderness, No organomegaly appriciated, No rebound - guarding or rigidity. No Cyanosis, Clubbing or edema,   slight swelling of the left foot 2.5x3cm oval skin ulcer on the bottom to medial aspect of the left heel, slight surround redness, and erythema extending toward ankle. Slight redness over the dorsum of the left foot.     Data Review:    CBC  Recent Labs Lab 09/01/17 1918 09/03/17 0557  WBC 9.2 8.0  HGB 14.4 14.0  HCT 45.3 42.7  PLT 243 186  MCV 87.1 84.2  MCH 27.7 27.6  MCHC 31.8 32.8  RDW 13.7 13.3    Chemistries   Recent Labs Lab 09/01/17 1918 09/03/17 0557  NA 145 139  K 3.8 3.7  CL 110 111  CO2 24 21*  GLUCOSE 106* 92  BUN 17 12  CREATININE 0.94 0.81  CALCIUM 9.9 9.2  AST 22 20  ALT 21 16  ALKPHOS 140* 122  BILITOT 0.5 0.7   ------------------------------------------------------------------------------------------------------------------ No results for input(s): CHOL, HDL, LDLCALC, TRIG, CHOLHDL, LDLDIRECT in the last 72 hours.  No results found for: HGBA1C ------------------------------------------------------------------------------------------------------------------  No results for input(s): TSH, T4TOTAL, T3FREE, THYROIDAB in the last 72 hours.  Invalid input(s): FREET3 ------------------------------------------------------------------------------------------------------------------ No results for input(s): VITAMINB12, FOLATE, FERRITIN, TIBC, IRON, RETICCTPCT in the last 72 hours.  Coagulation profile No results for input(s): INR, PROTIME in the last 168 hours.  No results for input(s): DDIMER in the last 72 hours.  Cardiac Enzymes No results for input(s): CKMB, TROPONINI, MYOGLOBIN in the last 168 hours.  Invalid input(s): CK ------------------------------------------------------------------------------------------------------------------ No results found for: BNP  Inpatient Medications  Scheduled Meds: .  enoxaparin (LOVENOX) injection  40 mg Subcutaneous Q24H  . losartan  25 mg Oral Daily  . memantine  5 mg Oral BID  . QUEtiapine  50 mg Oral Q1200  . QUEtiapine  50 mg Oral Daily  . QUEtiapine  75 mg Oral QHS  . silver sulfADIAZINE   Topical Daily  . sodium chloride flush  3 mL Intravenous Q12H   Continuous Infusions: . sodium chloride    . levofloxacin (LEVAQUIN) IV Stopped (09/02/17 2300)   PRN Meds:.sodium chloride, acetaminophen **OR** acetaminophen, diphenhydrAMINE, hydrALAZINE, sodium chloride flush  Micro Results Recent Results (from the past 240 hour(s))  Culture, blood (routine x 2)     Status: None (Preliminary result)   Collection Time: 09/01/17  7:18 PM  Result Value Ref Range Status   Specimen Description BLOOD LEFT ARM  Final   Special Requests IN PEDIATRIC BOTTLE Blood Culture adequate volume  Final   Culture  Setup Time   Final    GRAM POSITIVE COCCI IN CLUSTERS IN PEDIATRIC BOTTLE Organism ID to follow CRITICAL RESULT CALLED TO, READ BACK BY AND VERIFIED WITH: V.BRYK,PHARMD AT 0318 ON 09/03/17 BY G.MCADOO    Culture GRAM POSITIVE COCCI  Final   Report Status PENDING  Incomplete  Culture, blood (routine x 2)     Status: None (Preliminary result)   Collection Time: 09/01/17  7:18 PM  Result Value Ref Range Status   Specimen Description BLOOD RIGHT ARM  Final   Special Requests IN PEDIATRIC BOTTLE Blood Culture adequate volume  Final   Culture NO GROWTH < 24 HOURS  Final   Report Status PENDING  Incomplete  Blood Culture ID Panel (Reflexed)     Status: Abnormal   Collection Time: 09/01/17  7:18 PM  Result Value Ref Range Status   Enterococcus species NOT DETECTED NOT DETECTED Final   Listeria monocytogenes NOT DETECTED NOT DETECTED Final   Staphylococcus species DETECTED (A) NOT DETECTED Final    Comment: Methicillin (oxacillin) susceptible coagulase negative staphylococcus. Possible blood culture contaminant (unless isolated from more than one blood culture  draw or clinical case suggests pathogenicity). No antibiotic treatment is indicated for blood  culture contaminants. CRITICAL RESULT CALLED TO, READ BACK BY AND VERIFIED WITH: V.BRYK,PHARMD AT 1610 ON 09/03/17 BY G.MCADOO    Staphylococcus aureus NOT DETECTED NOT DETECTED Final   Methicillin resistance NOT DETECTED NOT DETECTED Final   Streptococcus species NOT DETECTED NOT DETECTED Final   Streptococcus agalactiae NOT DETECTED NOT DETECTED Final   Streptococcus pneumoniae NOT DETECTED NOT DETECTED Final   Streptococcus pyogenes NOT DETECTED NOT DETECTED Final   Acinetobacter baumannii NOT DETECTED NOT DETECTED Final   Enterobacteriaceae species NOT DETECTED NOT DETECTED Final   Enterobacter cloacae complex NOT DETECTED NOT DETECTED Final   Escherichia coli NOT DETECTED NOT DETECTED Final   Klebsiella oxytoca NOT DETECTED NOT DETECTED Final   Klebsiella pneumoniae NOT DETECTED NOT DETECTED Final   Proteus species NOT DETECTED NOT DETECTED Final  Serratia marcescens NOT DETECTED NOT DETECTED Final   Haemophilus influenzae NOT DETECTED NOT DETECTED Final   Neisseria meningitidis NOT DETECTED NOT DETECTED Final   Pseudomonas aeruginosa NOT DETECTED NOT DETECTED Final   Candida albicans NOT DETECTED NOT DETECTED Final   Candida glabrata NOT DETECTED NOT DETECTED Final   Candida krusei NOT DETECTED NOT DETECTED Final   Candida parapsilosis NOT DETECTED NOT DETECTED Final   Candida tropicalis NOT DETECTED NOT DETECTED Final  Aerobic Culture (superficial specimen)     Status: None (Preliminary result)   Collection Time: 09/02/17 12:38 AM  Result Value Ref Range Status   Specimen Description WOUND LEFT HEEL  Final   Special Requests NONE  Final   Gram Stain   Final    DEGENERATED CELLULAR MATERIAL PRESENT FEW GRAM POSITIVE COCCI IN PAIRS IN SINGLES RARE GRAM POSITIVE RODS    Culture PENDING  Incomplete   Report Status PENDING  Incomplete    Radiology Reports Dg Ankle 2 Views  Left  Result Date: 09/01/2017 CLINICAL DATA:  Skin ulcer on the left medial heel extending towards the ankle. EXAM: LEFT ANKLE - 2 VIEW COMPARISON:  None. FINDINGS: Diffuse soft tissue swelling over the left ankle. No evidence of acute fracture or dislocation. No focal bone lesion, bone destruction, bone sclerosis, or cortical changes. No radiopaque soft tissue foreign bodies. Overlying extrinsic structures may obscure small focal changes. IMPRESSION: Soft tissue swelling about the left ankle. No acute bony abnormalities. No radiographic changes of osteomyelitis. Electronically Signed   By: Burman Nieves M.D.   On: 09/01/2017 22:52    Time Spent in minutes  30   Pearson Grippe M.D on 09/03/2017 at 9:51 AM  Between 7am to 7am - Pager - [phone]

## 2017-09-03 NOTE — Consult Note (Signed)
WOC Nurse wound consult note Reason for Consult: Consult requested for left heel. Wound type: Unstageable pressure injury Pressure Injury POA: Yes Measurement: 4X4xm Wound bed: 100% eschar Drainage (amount, consistency, odor) no odor or drainage Periwound: Generalized erythremia surrounding the site, approx 3 cm Dressing procedure/placement/frequency: Silvadene has already been ordered for enzymatic debridement of nonviable tissue; agree with this plan of care.  Float heels to reduce pressure. No family members to discuss plan of care. Please re-consult if further assistance is needed.  Thank-you,  Cammie Mcgeeawn Kamilla Hands MSN, RN, CWOCN, [place]-AP, CNS [phone]

## 2017-09-03 NOTE — Progress Notes (Signed)
PHARMACY - PHYSICIAN COMMUNICATION CRITICAL VALUE ALERT - BLOOD CULTURE IDENTIFICATION (BCID)  Results for orders placed or performed during the hospital encounter of 09/01/17  Blood Culture ID Panel (Reflexed) (Collected: 09/01/2017  7:18 PM)  Result Value Ref Range   Enterococcus species NOT DETECTED NOT DETECTED   Listeria monocytogenes NOT DETECTED NOT DETECTED   Staphylococcus species DETECTED (A) NOT DETECTED   Staphylococcus aureus NOT DETECTED NOT DETECTED   Methicillin resistance NOT DETECTED NOT DETECTED   Streptococcus species NOT DETECTED NOT DETECTED   Streptococcus agalactiae NOT DETECTED NOT DETECTED   Streptococcus pneumoniae NOT DETECTED NOT DETECTED   Streptococcus pyogenes NOT DETECTED NOT DETECTED   Acinetobacter baumannii NOT DETECTED NOT DETECTED   Enterobacteriaceae species NOT DETECTED NOT DETECTED   Enterobacter cloacae complex NOT DETECTED NOT DETECTED   Escherichia coli NOT DETECTED NOT DETECTED   Klebsiella oxytoca NOT DETECTED NOT DETECTED   Klebsiella pneumoniae NOT DETECTED NOT DETECTED   Proteus species NOT DETECTED NOT DETECTED   Serratia marcescens NOT DETECTED NOT DETECTED   Haemophilus influenzae NOT DETECTED NOT DETECTED   Neisseria meningitidis NOT DETECTED NOT DETECTED   Pseudomonas aeruginosa NOT DETECTED NOT DETECTED   Candida albicans NOT DETECTED NOT DETECTED   Candida glabrata NOT DETECTED NOT DETECTED   Candida krusei NOT DETECTED NOT DETECTED   Candida parapsilosis NOT DETECTED NOT DETECTED   Candida tropicalis NOT DETECTED NOT DETECTED    Name of physician (or Provider) Contacted: KSchorr, NP  Changes to prescribed antibiotics required: Most likely contaminant, on Levaquin, no changes needed at this point.  Vernard GamblesVeronda Jeson Camacho, PharmD, BCPS  09/03/2017  4:15 AM

## 2017-09-03 NOTE — Progress Notes (Signed)
Patient was alone and not responsive.  I left materials for Advanced Directives with a note for family to call the Spiritual Care Dept if they would like to complete these forms in the hospital. Due to patient condition, did not try to explain.  Phebe CollaDonna S Giamarie Bueche, Chaplain    09/03/17 1000  Clinical Encounter Type  Visited With Patient  Visit Type Initial  Referral From Physician  Consult/Referral To Chaplain  Advance Directives (For Healthcare)  Does Patient Have a Medical Advance Directive? No

## 2017-09-04 DIAGNOSIS — Z515 Encounter for palliative care: Secondary | ICD-10-CM

## 2017-09-04 DIAGNOSIS — F0390 Unspecified dementia without behavioral disturbance: Secondary | ICD-10-CM

## 2017-09-04 DIAGNOSIS — Z7189 Other specified counseling: Secondary | ICD-10-CM

## 2017-09-04 DIAGNOSIS — L89620 Pressure ulcer of left heel, unstageable: Principal | ICD-10-CM

## 2017-09-04 LAB — CULTURE, BLOOD (ROUTINE X 2): Special Requests: ADEQUATE

## 2017-09-04 MED ORDER — SODIUM CHLORIDE 0.9 % IV SOLN
INTRAVENOUS | Status: AC
  Administered 2017-09-04: 09:00:00 via INTRAVENOUS

## 2017-09-04 NOTE — Consult Note (Signed)
Consultation Note Date: 09/04/2017   Patient Name: Cynthia Gilbert  DOB: 27-Apr-1946  MRN: [id number]  Age / Sex: 71 y.o., female  PCP: No primary care provider on file. Referring Physician: Pearson Grippe, MD  Reason for Consultation: Establishing goals of care  HPI/Patient Profile: 71 y.o. female  with past medical history of end stage dementia and hypertension admitted on 09/01/2017 with infected skin ulcer left heel with foul smell and redness. She was struggling to take her antibiotic that was prescribed at home for this.   Clinical Assessment and Goals of Care: Ms. Lizardo is lying in bed. She is nonverbal and flat affect. I fed her some of her lunch and she would accept food but made no effort to feed herself. She ate most of her lunch for me but only ~25% of her breakfast. Seemed to struggle with getting fluids through straw.   I spoke on the phone with her daughter, Thurston Hole. Thurston Hole tells me about her mother's progressing dementia and how she has become incontinent and bedbound and now most likely nonambulatory. Anne worries about Ms. Angello's husband's Verdon Cummins) ability to care for her now. She says that he has taken excellent care of her over the past 10 years but is concerned with her decreasing functional status. Thurston Hole also shares that she believes that it is important for them to be together but that they need extra assistance at home.   When discussing GOC Thurston Hole shares that she knows her mother's wishes would be for NO heroics, invasive, or aggressive care given her dementia. Thurston Hole is supportive of DNR and plans to speak with Verdon Cummins about this as well. We did discuss that given Ms. Benett's decline and concern with this wound on her foot and the ability to heal that she may be eligible for having hospice services at home to help [place] at home as well as being in line with Ms. Toback's goals. Thurston Hole believes that having  hospice assistance would be a good idea for them.   Anne plans to have [place] her at the hospital tomorrow ~9 am when I will plan to speak further with him and his concerns and GOC for Ms. Urda (including code status and hospice at home).   Primary Decision Maker NEXT OF KIN husband Verdon Cummins    SUMMARY OF RECOMMENDATIONS   - Will meet with husband tomorrow morning - Likely to go home with po antibiotics and hospice support at home  Code Status/Advance Care Planning:  Full code - daughter supportive of DNR   Symptom Management:   No current symptoms.   Requires careful hand feeding. She was feeding herself prior to this admission.   Palliative Prophylaxis:   Aspiration, Delirium Protocol, Oral Care and Turn Reposition  Additional Recommendations (Limitations, Scope, Preferences):  Avoid Hospitalization, No Artificial Feeding and No Surgical Procedures  Psycho-social/Spiritual:   Desire for further Chaplaincy support:yes  Additional Recommendations: Caregiving  Support/Resources and Education on Hospice  Prognosis:   < 6 months likely given quick decline with underlying dementia complicated  by infection  Discharge Planning: Home with Hospice is most likely     Primary Diagnoses: Present on Admission: . Skin ulcer (HCC) . Dementia   I have reviewed the medical record, interviewed the patient and family, and examined the patient. The following aspects are pertinent.  Past Medical History:  Diagnosis Date  . Dementia   . Hypertension    Social History   Social History  . Marital status: Married    Spouse name: N/A  . Number of children: N/A  . Years of education: N/A   Social History Main Topics  . Smoking status: Former Smoker    Packs/day: 1.00    Years: 25.00    Types: Cigarettes    Quit date: 11/13/2008  . Smokeless tobacco: Never Used  . Alcohol use No  . Drug use: No  . Sexual activity: Not Asked   Other Topics Concern  . None   Social  History Narrative  . None   Family History  Problem Relation Age of Onset  . COPD Mother   . Heart failure Father    Scheduled Meds: . enoxaparin (LOVENOX) injection  40 mg Subcutaneous Q24H  . erythromycin   Left Eye Q8H  . losartan  25 mg Oral Daily  . memantine  5 mg Oral BID  . QUEtiapine  50 mg Oral Q1200  . QUEtiapine  50 mg Oral Daily  . QUEtiapine  75 mg Oral QHS  . silver sulfADIAZINE   Topical Daily  . sodium chloride flush  3 mL Intravenous Q12H   Continuous Infusions: . sodium chloride    . sodium chloride 75 mL/hr at 09/04/17 0843  . levofloxacin (LEVAQUIN) IV Stopped (09/03/17 2311)   PRN Meds:.sodium chloride, acetaminophen **OR** acetaminophen, diphenhydrAMINE, hydrALAZINE, sodium chloride flush Allergies  Allergen Reactions  . Amoxicillin Anaphylaxis  . Penicillins Rash  . Vancomycin Other (See Comments)    Red Man's Syndrome Consider slowing infusion rate and adding pre-medication (ie. benadryl)   Review of Systems  Unable to perform ROS: Dementia    Physical Exam  Constitutional: She appears well-developed. She appears ill.  HENT:  Head: Normocephalic and atraumatic.  Cardiovascular: Normal rate and regular rhythm.   Pulmonary/Chest: Effort normal and breath sounds normal. No accessory muscle usage. No tachypnea. No respiratory distress.  Abdominal: Soft. Normal appearance.  Neurological: She is alert. She is disoriented.  Nonverbal   Nursing note and vitals reviewed.   Vital Signs: BP 137/67 (BP Location: Right Arm)   Pulse 73   Temp 97.6 F (36.4 C) (Oral)   Resp 18   Ht 5\' 5"  (1.651 m)   Wt 64.4 kg (141 lb 15.6 oz)   SpO2 99%   BMI 23.63 kg/m  Pain Assessment: PAINAD       SpO2: SpO2: 99 % O2 Device:SpO2: 99 % O2 Flow Rate: .   IO: Intake/output summary:  Intake/Output Summary (Last 24 hours) at 09/04/17 1446 Last data filed at 09/04/17 0605  Gross per 24 hour  Intake                0 ml  Output              500 ml  Net              -500 ml    LBM: Last BM Date: 09/02/17 Baseline Weight: Weight: 68.5 kg (151 lb 0.2 oz) Most recent weight: Weight: 64.4 kg (141 lb 15.6 oz)     Palliative Assessment/Data:  20%    Time Total: 70 min  Greater than 50%  of this time was spent counseling and coordinating care related to the above assessment and plan.  Signed by: Yong ChannelAlicia Dalene Robards, NP Palliative Medicine Team Pager # [phone] (M-F 8a-5p) Team Phone # [phone] (Nights/Weekends)

## 2017-09-04 NOTE — Progress Notes (Addendum)
Patient ID: Cynthia Gilbert, female   DOB: 11-28-1945, 71 y.o.   MRN: [id number]                                                                PROGRESS NOTE                                                                                                                                                                                                             Patient Demographics:    Cynthia Gilbert, is a 71 y.o. female, DOB - Apr 12, 1946, WUJ:[id number]  Admit date - 09/01/2017   Admitting Physician Pearson Grippe, MD  Outpatient Primary MD for the patient is No primary care provider on file.  LOS - 3  Outpatient Specialists:  No chief complaint on file.     Cellulitis, skin ulcer  Brief Narrative   71 y.o.female,w end stage dementia apparently has skin ulcer on the left heel, husband apparently noticed this the other day and she was started on bactrim by her pcp. She was unable to take the medicine due to her dementia and there was a foul smelling odor coming from her heel today. There was redness extending from the skin ulcer up her heel towards her ankle. Pt denies fever, chills, cp, palp, cough, sob. There is slight swelling of the left foot in comparison to the right. Pt will be admitted for skin ulcer and cellulitis.    Subjective:    [place] Ped  Yesterday had red left eye.  Palliative care consulted, awaiting input.  Wound culture prel Gram positive cocci   1/2 blood cultre mssa Afebrile  No headache, No chest pain, No abdominal pain - No Nausea, No new weakness tingling or numbness, No Cough - SOB.    Assessment  & Plan :    Principal Problem:   Skin ulcer (HCC) Active Problems:   Dementia   Cellulitis   Hyperglycemia   Essential hypertension    Skin ulcer/ celluilitis Xray=> negative osteomyelitis Blood culture => 1/2 mssa Wound culture=> gram stain few gram positive cocci in pairs Wound care consult DC Bactrim 10/20 Levaquin iv 10/20=> vanco 10/20=> ?  Red man syndrome, STOPPED Awaiting final cuture results prior to DC  Dementia Cont namenda Awaiting Palliative care consult to discuss her code status with  husband  Hypertension Cont losartan Cont hydralazine 5mg  iv q6h prn sbp >160   Code Status:FULL CODE  Family Communication :w daughter  Disposition Plan: home ? tomorrow  Barriers For Discharge:  Consults :Wound care  Procedures :   DVT Prophylaxis: Lovenox- SCDs  Antibiotics :vanco 10/20, levaquin 10/20=>   Anti-infectives    Start     Dose/Rate Route Frequency Ordered Stop   09/02/17 2100  vancomycin (VANCOCIN) 1,250 mg in sodium chloride 0.9 % 250 mL IVPB  Status:  Discontinued     1,250 mg 83.3 mL/hr over 180 Minutes Intravenous Every 24 hours 09/02/17 0940 09/02/17 1017   09/01/17 2100  levofloxacin (LEVAQUIN) IVPB 500 mg     500 mg 100 mL/hr over 60 Minutes Intravenous Every 24 hours 09/01/17 2026     09/01/17 2100  vancomycin (VANCOCIN) 1,250 mg in sodium chloride 0.9 % 250 mL IVPB  Status:  Discontinued     1,250 mg 166.7 mL/hr over 90 Minutes Intravenous Every 24 hours 09/01/17 2026 09/02/17 0940        Objective:   Vitals:   09/03/17 1411 09/03/17 2226 09/04/17 0559 09/04/17 0618  BP: (!) 151/84 139/74  (!) 141/77  Pulse: 75 74  70  Resp: 16 18  18   Temp: 97.8 F (36.6 C) 98.2 F (36.8 C)  97.6 F (36.4 C)  TempSrc: Oral Oral  Oral  SpO2: 97% 98%  99%  Weight:   64.4 kg (141 lb 15.6 oz)   Height:        Wt Readings from Last 3 Encounters:  09/04/17 64.4 kg (141 lb 15.6 oz)     Intake/Output Summary (Last 24 hours) at 09/04/17 0758 Last data filed at 09/04/17 0605  Gross per 24 hour  Intake               65 ml  Output              500 ml  Net             -435 ml     Physical Exam  Awake Alert, Oriented X 3, No new F.N deficits, Normal affect [place].AT,PERRAL Supple Neck,No JVD, No cervical lymphadenopathy appriciated.  Symmetrical Chest wall  movement, Good air movement bilaterally, CTAB RRR,No Gallops,Rubs or new Murmurs, No Parasternal Heave +ve B.Sounds, Abd Soft, No tenderness, No organomegaly appriciated, No rebound - guarding or rigidity. No Cyanosis, Clubbing or edema  slight swelling of the left foot improved. 2.5x3cm oval skin ulcer on the bottom to medial aspect of the left heel, slight surround redness improving and erythema extending toward ankle. Slight redness over the dorsum of the left foot. resolved   Data Review:    CBC  Recent Labs Lab 09/01/17 1918 09/03/17 0557  WBC 9.2 8.0  HGB 14.4 14.0  HCT 45.3 42.7  PLT 243 186  MCV 87.1 84.2  MCH 27.7 27.6  MCHC 31.8 32.8  RDW 13.7 13.3    Chemistries   Recent Labs Lab 09/01/17 1918 09/03/17 0557  NA 145 139  K 3.8 3.7  CL 110 111  CO2 24 21*  GLUCOSE 106* 92  BUN 17 12  CREATININE 0.94 0.81  CALCIUM 9.9 9.2  AST 22 20  ALT 21 16  ALKPHOS 140* 122  BILITOT 0.5 0.7   ------------------------------------------------------------------------------------------------------------------ No results for input(s): CHOL, HDL, LDLCALC, TRIG, CHOLHDL, LDLDIRECT in the last 72 hours.  No results found for: HGBA1C ------------------------------------------------------------------------------------------------------------------ No results for input(s): TSH, T4TOTAL, T3FREE,  THYROIDAB in the last 72 hours.  Invalid input(s): FREET3 ------------------------------------------------------------------------------------------------------------------ No results for input(s): VITAMINB12, FOLATE, FERRITIN, TIBC, IRON, RETICCTPCT in the last 72 hours.  Coagulation profile No results for input(s): INR, PROTIME in the last 168 hours.  No results for input(s): DDIMER in the last 72 hours.  Cardiac Enzymes No results for input(s): CKMB, TROPONINI, MYOGLOBIN in the last 168 hours.  Invalid input(s):  CK ------------------------------------------------------------------------------------------------------------------ No results found for: BNP  Inpatient Medications  Scheduled Meds: . enoxaparin (LOVENOX) injection  40 mg Subcutaneous Q24H  . erythromycin   Left Eye Q8H  . losartan  25 mg Oral Daily  . memantine  5 mg Oral BID  . QUEtiapine  50 mg Oral Q1200  . QUEtiapine  50 mg Oral Daily  . QUEtiapine  75 mg Oral QHS  . silver sulfADIAZINE   Topical Daily  . sodium chloride flush  3 mL Intravenous Q12H   Continuous Infusions: . sodium chloride    . sodium chloride    . levofloxacin (LEVAQUIN) IV Stopped (09/03/17 2311)   PRN Meds:.sodium chloride, acetaminophen **OR** acetaminophen, diphenhydrAMINE, hydrALAZINE, sodium chloride flush  Micro Results Recent Results (from the past 240 hour(s))  Culture, blood (routine x 2)     Status: None (Preliminary result)   Collection Time: 09/01/17  7:18 PM  Result Value Ref Range Status   Specimen Description BLOOD LEFT ARM  Final   Special Requests IN PEDIATRIC BOTTLE Blood Culture adequate volume  Final   Culture  Setup Time   Final    GRAM POSITIVE COCCI IN CLUSTERS IN PEDIATRIC BOTTLE CRITICAL RESULT CALLED TO, READ BACK BY AND VERIFIED WITH: V.BRYK,PHARMD AT 0318 ON 09/03/17 BY G.MCADOO    Culture   Final    GRAM POSITIVE COCCI CULTURE REINCUBATED FOR BETTER GROWTH    Report Status PENDING  Incomplete  Culture, blood (routine x 2)     Status: None (Preliminary result)   Collection Time: 09/01/17  7:18 PM  Result Value Ref Range Status   Specimen Description BLOOD RIGHT ARM  Final   Special Requests IN PEDIATRIC BOTTLE Blood Culture adequate volume  Final   Culture NO GROWTH 2 DAYS  Final   Report Status PENDING  Incomplete  Blood Culture ID Panel (Reflexed)     Status: Abnormal   Collection Time: 09/01/17  7:18 PM  Result Value Ref Range Status   Enterococcus species NOT DETECTED NOT DETECTED Final   Listeria  monocytogenes NOT DETECTED NOT DETECTED Final   Staphylococcus species DETECTED (A) NOT DETECTED Final    Comment: Methicillin (oxacillin) susceptible coagulase negative staphylococcus. Possible blood culture contaminant (unless isolated from more than one blood culture draw or clinical case suggests pathogenicity). No antibiotic treatment is indicated for blood  culture contaminants. CRITICAL RESULT CALLED TO, READ BACK BY AND VERIFIED WITH: V.BRYK,PHARMD AT 1610 ON 09/03/17 BY G.MCADOO    Staphylococcus aureus NOT DETECTED NOT DETECTED Final   Methicillin resistance NOT DETECTED NOT DETECTED Final   Streptococcus species NOT DETECTED NOT DETECTED Final   Streptococcus agalactiae NOT DETECTED NOT DETECTED Final   Streptococcus pneumoniae NOT DETECTED NOT DETECTED Final   Streptococcus pyogenes NOT DETECTED NOT DETECTED Final   Acinetobacter baumannii NOT DETECTED NOT DETECTED Final   Enterobacteriaceae species NOT DETECTED NOT DETECTED Final   Enterobacter cloacae complex NOT DETECTED NOT DETECTED Final   Escherichia coli NOT DETECTED NOT DETECTED Final   Klebsiella oxytoca NOT DETECTED NOT DETECTED Final   Klebsiella pneumoniae NOT DETECTED NOT  DETECTED Final   Proteus species NOT DETECTED NOT DETECTED Final   Serratia marcescens NOT DETECTED NOT DETECTED Final   Haemophilus influenzae NOT DETECTED NOT DETECTED Final   Neisseria meningitidis NOT DETECTED NOT DETECTED Final   Pseudomonas aeruginosa NOT DETECTED NOT DETECTED Final   Candida albicans NOT DETECTED NOT DETECTED Final   Candida glabrata NOT DETECTED NOT DETECTED Final   Candida krusei NOT DETECTED NOT DETECTED Final   Candida parapsilosis NOT DETECTED NOT DETECTED Final   Candida tropicalis NOT DETECTED NOT DETECTED Final  Aerobic Culture (superficial specimen)     Status: None (Preliminary result)   Collection Time: 09/02/17 12:38 AM  Result Value Ref Range Status   Specimen Description WOUND LEFT HEEL  Final   Special  Requests NONE  Final   Gram Stain   Final    DEGENERATED CELLULAR MATERIAL PRESENT FEW GRAM POSITIVE COCCI IN PAIRS IN SINGLES RARE GRAM POSITIVE RODS    Culture ABUNDANT STAPHYLOCOCCUS AUREUS  Final   Report Status PENDING  Incomplete    Radiology Reports Dg Ankle 2 Views Left  Result Date: 09/01/2017 CLINICAL DATA:  Skin ulcer on the left medial heel extending towards the ankle. EXAM: LEFT ANKLE - 2 VIEW COMPARISON:  None. FINDINGS: Diffuse soft tissue swelling over the left ankle. No evidence of acute fracture or dislocation. No focal bone lesion, bone destruction, bone sclerosis, or cortical changes. No radiopaque soft tissue foreign bodies. Overlying extrinsic structures may obscure small focal changes. IMPRESSION: Soft tissue swelling about the left ankle. No acute bony abnormalities. No radiographic changes of osteomyelitis. Electronically Signed   By: Burman NievesWilliam  Stevens M.D.   On: 09/01/2017 22:52    Time Spent in minutes  30   Pearson GrippeJames Coran Dipaola M.D on 09/04/2017 at 7:58 AM  Between 7am to 7am - Pager [phone]- [phone]

## 2017-09-05 DIAGNOSIS — Z7189 Other specified counseling: Secondary | ICD-10-CM

## 2017-09-05 DIAGNOSIS — L89620 Pressure ulcer of left heel, unstageable: Secondary | ICD-10-CM

## 2017-09-05 DIAGNOSIS — Z515 Encounter for palliative care: Secondary | ICD-10-CM

## 2017-09-05 LAB — COMPREHENSIVE METABOLIC PANEL
ALBUMIN: 3.2 g/dL — AB (ref 3.5–5.0)
ALK PHOS: 126 U/L (ref 38–126)
ALT: 23 U/L (ref 14–54)
ANION GAP: 8 (ref 5–15)
AST: 25 U/L (ref 15–41)
BUN: 12 mg/dL (ref 6–20)
CO2: 20 mmol/L — AB (ref 22–32)
Calcium: 9.4 mg/dL (ref 8.9–10.3)
Chloride: 109 mmol/L (ref 101–111)
Creatinine, Ser: 0.7 mg/dL (ref 0.44–1.00)
GFR calc Af Amer: >60 mL/min (ref >60)
GFR calc non Af Amer: >60 mL/min (ref >60)
GLUCOSE: 98 mg/dL (ref 65–99)
POTASSIUM: 3.7 mmol/L (ref 3.5–5.1)
SODIUM: 137 mmol/L (ref 135–145)
Total Bilirubin: 0.6 mg/dL (ref 0.3–1.2)
Total Protein: 6 g/dL — ABNORMAL LOW (ref 6.5–8.1)

## 2017-09-05 LAB — CBC
HCT: 42.7 % (ref 36.0–46.0)
Hemoglobin: 14.1 g/dL (ref 12.0–15.0)
MCH: 27.5 pg (ref 26.0–34.0)
MCHC: 33 g/dL (ref 30.0–36.0)
MCV: 83.4 fL (ref 78.0–100.0)
Platelets: 196 10*3/uL (ref 150–400)
RBC: 5.12 MIL/uL — ABNORMAL HIGH (ref 3.87–5.11)
RDW: 13.2 % (ref 11.5–15.5)
WBC: 7.6 10*3/uL (ref 4.0–10.5)

## 2017-09-05 LAB — AEROBIC CULTURE  (SUPERFICIAL SPECIMEN)

## 2017-09-05 LAB — AEROBIC CULTURE W GRAM STAIN (SUPERFICIAL SPECIMEN)

## 2017-09-05 MED ORDER — SILVER SULFADIAZINE 1 % EX CREA: TOPICAL_CREAM | Freq: Every day | CUTANEOUS | 0 refills | Status: AC

## 2017-09-05 MED ORDER — LEVOFLOXACIN 500 MG PO TABS: 500.0000 mg | ORAL_TABLET | Freq: Every day | ORAL | 0 refills | Status: AC

## 2017-09-05 MED ORDER — ERYTHROMYCIN 5 MG/GM OP OINT: TOPICAL_OINTMENT | Freq: Three times a day (TID) | OPHTHALMIC | 0 refills | Status: AC

## 2017-09-05 NOTE — Care Management Note (Addendum)
Case Management Note  Patient Details  Name: Cynthia Gilbert MRN: [id number] Date of Birth: Nov 08, 1946  Subjective/Objective:       Admitted with L infected heel ulcer, hx of  end stage dementia. CM received consult for home hospice. CM spoke with husband and daughter @ bedside regarding home hospice. Choice offered and [facility] [facility].  PCP: Carollee SiresSherrie Ryder [phone],[phone]  Action/Plan: Plan is to d/c to home today with hospice care. Family to provide transportation to home.  Expected Discharge Date:  09/05/17               Expected Discharge Plan:  Home w Hospice Care  In-House Referral:     Discharge planning Services  CM Consult  Post Acute Care Choice:    Choice offered to:  Adult Children, Spouse  DME Arranged:    DME Agency:     HH Arranged:    HH Agency:  Other - See comment ([facility] [place] referral made with Cassandra , intake coordinator and home hospice order, demographics, H&P, and palliative note faxed to [phone].  Status of Service:  Completed, signed off  If discussed at Long Length of Stay Meetings, dates discussed:    Additional Comments: 1415 CM made aware by hospice agency needing pt's physical address , have tried to reach family member Cynthia Gilbert by # given but no answer.  CM called pt's home # and spoke with pt' husband Cynthia Cummins(Jesse) and daughter Cynthia Fiscal(Lori) and obtained physical address: [address], [place], [place] [address]. CM then called and shared information with [facility]  @ [phone].   Gae GallopCole, Ieasha Boerema [place], RN 09/05/2017, 11:20 AM

## 2017-09-05 NOTE — Discharge Summary (Addendum)
Cynthia Gilbert, is a 71 y.o. female  DOB [birth year]  MRN [id number].  Admission date:  09/01/2017  Admitting Physician  Jani Gravel, MD  Discharge Date:  09/05/2017   Primary MD  No primary care provider on file.  Ryter-Brown  Recommendations for primary care physician for things to follow:    Skin ulcer/ celluilitis Xray 10/20 => negative osteomyelitis Blood culture => 1/2 mssa Wound culture=> gram stain few gram positive cocci in pairs, culture staph aureus (pansensitive) Wound care consult=> silvadene topically qday Levaquin 537m poqday x 7days   Hypertension Cont losartan  L eye conjuctivitis E mycin opthalmic ointment tidx1week   Dementia Continue namenda Palliative care consulted  CODE STATUS=> DNR,  HOSPICE    Admission Diagnosis  Cellullitis Skin Ulcer   Discharge Diagnosis  Cellullitis Skin Ulcer    Principal Problem:   Skin ulcer ([facility] Active Problems:   Dementia   Cellulitis   Hyperglycemia   Essential hypertension   Pressure injury of left heel, unstageable ([facility]   Goals of care, counseling/discussion   Palliative care encounter      Past Medical History:  Diagnosis Date  . Dementia   . Hypertension     Past Surgical History:  Procedure Laterality Date  . ABDOMINAL HYSTERECTOMY    . CATARACT EXTRACTION         HPI  from the history and physical done on the day of admission:     71y.o.female,w end stage dementia apparently has skin ulcer on the left heel, husband apparently noticed this the other day and she was started on bactrim by her pcp. She was unable to take the medicine due to her dementia and there was a foul smelling odor coming from her heel today. There was redness extending from the skin ulcer up her heel towards her ankle. Pt denies fever, chills, cp, palp, cough, sob. There is slight swelling of the left foot in comparison  to the right. Pt will be admitted for skin ulcer and cellulitis.     Hospital Course:     Pt was admitted to hospital for left heel skin ulcer and cellulitis.  Pt was empirically started on vanco and levaquin.  Pt developed redness after 1st dose of vanco on 10/20 and this was stopped. Pt gram stain 10/20=> gram positive cocci, culture eventually grew out staph aureus pan sensitive.  Blood culture 1/2 mssa (likely contaminant).  Pt continued on levaquin 5081miv qday,  Heel skin ulcer improving 3cm => 2.7cm and redness from cellulitis improved.  Pt remained afebrile and stable.  Pt family met with palliative care and chose hospice, and DNR code status.      Follow UP  Follow-up Information    Ryter-Brown, ShShyrl NumbersMD Follow up in 1 week(s).   Specialty:  Family Medicine Contact information: [phone]          Consults obtained - palliative care  Discharge Condition: stable  Diet and Activity recommendation: See Discharge Instructions below  Discharge Instructions      Discharge Medications     Allergies as of 09/05/2017      Reactions   Amoxicillin Anaphylaxis   Penicillins Rash   Vancomycin Other (See Comments)   Red Man's Syndrome Consider slowing infusion rate and adding pre-medication (ie. benadryl)      Medication List    STOP taking these medications   sulfamethoxazole-trimethoprim 800-160 MG tablet Commonly known as:  BACTRIM DS,SEPTRA DS     TAKE these medications   erythromycin ophthalmic ointment Place into the left eye every 8 (eight) hours.   levofloxacin 500 MG tablet Commonly known as:  LEVAQUIN Take 1 tablet (500 mg total) by mouth daily.   losartan 25 MG tablet Commonly known as:  COZAAR Take 25 mg by mouth daily.   memantine 5 MG tablet Commonly known as:  NAMENDA Take 5 mg by mouth 2 (two) times daily.   QUEtiapine 50 MG tablet Commonly known as:  SEROQUEL Take 50  mg by mouth See admin instructions. Take 50 mg at in the morning and at lunch  Take 75 mg in the evening   silver sulfADIAZINE 1 % cream Commonly known as:  SILVADENE Apply topically daily.       Major procedures and Radiology Reports - PLEASE review detailed and final reports for all details, in brief -      Dg Ankle 2 Views Left  Result Date: 09/01/2017 CLINICAL DATA:  Skin ulcer on the left medial heel extending towards the ankle. EXAM: LEFT ANKLE - 2 VIEW COMPARISON:  None. FINDINGS: Diffuse soft tissue swelling over the left ankle. No evidence of acute fracture or dislocation. No focal bone lesion, bone destruction, bone sclerosis, or cortical changes. No radiopaque soft tissue foreign bodies. Overlying extrinsic structures may obscure small focal changes. IMPRESSION: Soft tissue swelling about the left ankle. No acute bony abnormalities. No radiographic changes of osteomyelitis. Electronically Signed   By: Lucienne Capers M.D.   On: 09/01/2017 22:52    Micro Results     Recent Results (from the past 240 hour(s))  Culture, blood (routine x 2)     Status: Abnormal   Collection Time: 09/01/17  7:18 PM  Result Value Ref Range Status   Specimen Description BLOOD LEFT ARM  Final   Special Requests IN PEDIATRIC BOTTLE Blood Culture adequate volume  Final   Culture  Setup Time   Final    GRAM POSITIVE COCCI IN CLUSTERS IN PEDIATRIC BOTTLE CRITICAL RESULT CALLED TO, READ BACK BY AND VERIFIED WITH: V.BRYK,PHARMD AT 1017 ON 09/03/17 BY G.MCADOO    Culture (A)  Final    STAPHYLOCOCCUS SPECIES (COAGULASE NEGATIVE) THE SIGNIFICANCE OF ISOLATING THIS ORGANISM FROM A SINGLE SET OF BLOOD CULTURES WHEN MULTIPLE SETS ARE DRAWN IS UNCERTAIN. PLEASE NOTIFY THE MICROBIOLOGY DEPARTMENT WITHIN ONE WEEK IF SPECIATION AND SENSITIVITIES ARE REQUIRED.    Report Status 09/04/2017 FINAL  Final  Culture, blood (routine x 2)     Status: None (Preliminary result)   Collection Time: 09/01/17  7:18 PM   Result Value Ref Range Status   Specimen Description BLOOD RIGHT ARM  Final   Special Requests IN PEDIATRIC BOTTLE Blood Culture adequate volume  Final   Culture NO GROWTH 4 DAYS  Final   Report Status PENDING  Incomplete  Blood Culture ID Panel (Reflexed)     Status: Abnormal   Collection Time: 09/01/17  7:18 PM  Result Value Ref Range  Status   Enterococcus species NOT DETECTED NOT DETECTED Final   Listeria monocytogenes NOT DETECTED NOT DETECTED Final   Staphylococcus species DETECTED (A) NOT DETECTED Final    Comment: Methicillin (oxacillin) susceptible coagulase negative staphylococcus. Possible blood culture contaminant (unless isolated from more than one blood culture draw or clinical case suggests pathogenicity). No antibiotic treatment is indicated for blood  culture contaminants. CRITICAL RESULT CALLED TO, READ BACK BY AND VERIFIED WITH: V.BRYK,PHARMD AT 0318 ON 09/03/17 BY G.MCADOO    Staphylococcus aureus NOT DETECTED NOT DETECTED Final   Methicillin resistance NOT DETECTED NOT DETECTED Final   Streptococcus species NOT DETECTED NOT DETECTED Final   Streptococcus agalactiae NOT DETECTED NOT DETECTED Final   Streptococcus pneumoniae NOT DETECTED NOT DETECTED Final   Streptococcus pyogenes NOT DETECTED NOT DETECTED Final   Acinetobacter baumannii NOT DETECTED NOT DETECTED Final   Enterobacteriaceae species NOT DETECTED NOT DETECTED Final   Enterobacter cloacae complex NOT DETECTED NOT DETECTED Final   Escherichia coli NOT DETECTED NOT DETECTED Final   Klebsiella oxytoca NOT DETECTED NOT DETECTED Final   Klebsiella pneumoniae NOT DETECTED NOT DETECTED Final   Proteus species NOT DETECTED NOT DETECTED Final   Serratia marcescens NOT DETECTED NOT DETECTED Final   Haemophilus influenzae NOT DETECTED NOT DETECTED Final   Neisseria meningitidis NOT DETECTED NOT DETECTED Final   Pseudomonas aeruginosa NOT DETECTED NOT DETECTED Final   Candida albicans NOT DETECTED NOT DETECTED  Final   Candida glabrata NOT DETECTED NOT DETECTED Final   Candida krusei NOT DETECTED NOT DETECTED Final   Candida parapsilosis NOT DETECTED NOT DETECTED Final   Candida tropicalis NOT DETECTED NOT DETECTED Final  Aerobic Culture (superficial specimen)     Status: None (Preliminary result)   Collection Time: 09/02/17 12:38 AM  Result Value Ref Range Status   Specimen Description WOUND LEFT HEEL  Final   Special Requests NONE  Final   Gram Stain   Final    DEGENERATED CELLULAR MATERIAL PRESENT FEW GRAM POSITIVE COCCI IN PAIRS IN SINGLES RARE GRAM POSITIVE RODS    Culture ABUNDANT STAPHYLOCOCCUS AUREUS  Final   Report Status PENDING  Incomplete   Organism ID, Bacteria STAPHYLOCOCCUS AUREUS  Final      Susceptibility   Staphylococcus aureus - MIC*    CIPROFLOXACIN <=0.5 SENSITIVE Sensitive     ERYTHROMYCIN <=0.25 SENSITIVE Sensitive     GENTAMICIN <=0.5 SENSITIVE Sensitive     OXACILLIN 0.5 SENSITIVE Sensitive     TETRACYCLINE <=1 SENSITIVE Sensitive     VANCOMYCIN <=0.5 SENSITIVE Sensitive     TRIMETH/SULFA <=10 SENSITIVE Sensitive     CLINDAMYCIN <=0.25 SENSITIVE Sensitive     RIFAMPIN <=0.5 SENSITIVE Sensitive     Inducible Clindamycin NEGATIVE Sensitive     * ABUNDANT STAPHYLOCOCCUS AUREUS       Today   Subjective    Cynthia Gilbert today is axox1,  Afebrile.  More alert. Cellulitis resolved,  Skin ulcer, getting smaller.   Objective   Blood pressure 140/81, pulse 76, temperature 97.6 F (36.4 C), temperature source Oral, resp. rate 18, height 5' 5"  (1.651 m), weight 68 kg (149 lb 14.6 oz), SpO2 99 %.   Intake/Output Summary (Last 24 hours) at 09/05/17 1106 Last data filed at 09/04/17 2125  Gross per 24 hour  Intake           621.25 ml  Output              400 ml  Net  221.25 ml    Exam Awake Alert, Oriented x 1, No new F.N deficits, Normal affect [place].AT,PERRAL Supple Neck,No JVD, No cervical lymphadenopathy appriciated.  Symmetrical Chest wall  movement, Good air movement bilaterally, CTAB RRR,No Gallops,Rubs or new Murmurs, No Parasternal Heave +ve B.Sounds, Abd Soft, Non tender, No organomegaly appriciated, No rebound -guarding or rigidity. No Cyanosis, Clubbing or edema Redness resolved.    Skin ulcer 2.7cm on the dorsum of the left heel   Data Review   CBC w Diff:  Lab Results  Component Value Date   WBC 7.6 09/05/2017   HGB 14.1 09/05/2017   HCT 42.7 09/05/2017   PLT 196 09/05/2017    CMP:  Lab Results  Component Value Date   NA 137 09/05/2017   K 3.7 09/05/2017   CL 109 09/05/2017   CO2 20 (L) 09/05/2017   BUN 12 09/05/2017   CREATININE 0.70 09/05/2017   PROT 6.0 (L) 09/05/2017   ALBUMIN 3.2 (L) 09/05/2017   BILITOT 0.6 09/05/2017   ALKPHOS 126 09/05/2017   AST 25 09/05/2017   ALT 23 09/05/2017  .   Total Time in preparing paper work, data evaluation and todays exam - 41 minutes  Jani Gravel M.D on 09/05/2017 at 11:06 AM [phone]

## 2017-09-05 NOTE — Progress Notes (Signed)
Cynthia RhymesLeslie J Gilbert to be D/C'd to home with hospice per MD order.  Discussed with the patient family and all questions fully answered.  VSS, Skin clean, dry and intact without evidence of skin break down, no evidence of skin tears noted. IV catheter discontinued intact. Site without signs and symptoms of complications. Dressing and pressure applied.  An After Visit Summary was printed and given to the patient. Patient family received prescriptions.  D/c education completed with patient/family including follow up instructions, medication list, d/c activities limitations if indicated, with other d/c instructions as indicated by MD - patient able to verbalize understanding, all questions fully answered.   Patient family instructed to return to ED, call 911, or call MD for any changes in condition.   Patient escorted via WC, and D/C home via private auto.  Cynthia HaffKayla L Gilbert 09/05/2017 12:25 PM

## 2017-09-05 NOTE — Progress Notes (Signed)
Daily Progress Note   Patient Name: Cynthia Gilbert       Date: 09/05/2017 DOB: 05/29/1946  Age: 71 y.o. MRN#: [id number] Attending Physician: Jani Gravel, MD Primary Care Physician: No primary care provider on file. Admit Date: 09/01/2017  Reason for Consultation/Follow-up: Establishing goals of care  Subjective: [name] is much unchanged today. Left eye and face is still red (possibly reaction to antibiotic earlier in admission?). Family at bedside.   Length of Stay: 4  Current Medications: Scheduled Meds:  . enoxaparin (LOVENOX) injection  40 mg Subcutaneous Q24H  . erythromycin   Left Eye Q8H  . losartan  25 mg Oral Daily  . memantine  5 mg Oral BID  . QUEtiapine  50 mg Oral Q1200  . QUEtiapine  50 mg Oral Daily  . QUEtiapine  75 mg Oral QHS  . silver sulfADIAZINE   Topical Daily  . sodium chloride flush  3 mL Intravenous Q12H    Continuous Infusions: . sodium chloride    . levofloxacin [facility]) IV Stopped (09/04/17 2207)    PRN Meds: sodium chloride, acetaminophen **OR** acetaminophen, diphenhydrAMINE, hydrALAZINE, sodium chloride flush  Physical Exam         Constitutional: She appears well-developed. She appears ill.  HENT: Left eye red, swollen Head: Normocephalic and atraumatic.  Cardiovascular: Normal rate and regular rhythm.   Pulmonary/Chest: Effort normal and breath sounds normal. No accessory muscle usage. No tachypnea. No respiratory distress.  Abdominal: Soft. Normal appearance.  Neurological: She is alert. She is disoriented.  Nonverbal   Nursing note and vitals reviewed.  Vital Signs: BP 140/81 (BP Location: Right Arm)   Pulse 76   Temp 97.6 F (36.4 C) (Oral)   Resp 18   Ht 5' 5"  (1.651 m)   Wt 68 kg (149 lb 14.6 oz)   SpO2 99%   BMI  24.95 kg/m  SpO2: SpO2: 99 % O2 Device: O2 Device: Not Delivered O2 Flow Rate:    Intake/output summary:  Intake/Output Summary (Last 24 hours) at 09/05/17 1002 Last data filed at 09/04/17 2125  Gross per 24 hour  Intake           621.25 ml  Output              400 ml  Net  221.25 ml   LBM: Last BM Date: 09/05/17 Baseline Weight: Weight: 68.5 kg (151 lb 0.2 oz) Most recent weight: Weight: 68 kg (149 lb 14.6 oz)       Palliative Assessment/Data: 20%      Patient Active Problem List   Diagnosis Date Noted  . Pressure injury of left heel, unstageable ([place])   . Goals of care, counseling/discussion   . Palliative care encounter   . Hyperglycemia 09/02/2017  . Essential hypertension 09/02/2017  . Skin ulcer ([place]) 09/01/2017  . Dementia 09/01/2017  . Cellulitis 09/01/2017    Palliative Care Assessment & Plan   HPI: 71 y.o. female  with past medical history of end stage dementia and hypertension admitted on 09/01/2017 with infected skin ulcer left heel with foul smell and redness. She was struggling to take her antibiotic that was prescribed at home for this.   Assessment: I met today with [name] family including daughter Webb Silversmith and husband Denyse Amass. We were joined by chaplain Marjory Lies for additional support. Had discussed today with Denyse Amass regarding [place] and plan to best care for his wife. He has had a chance to speak with Webb Silversmith and family to speak together. Denyse Amass confirms that he is aware of his wife's desires for NO heroics, invasive, or aggressive treatment. He agrees with DNR and NO feeding tube. We further discussed caregiver burden and their goal for comfort, dignity, and QOL. We discussed the option of having hospice to follow and help support them at home. Denyse Amass agrees that this would be a good idea for [name]. Denyse Amass shares that he has cared for other family when they were dying so is familiar with what to do and what to expect. All questions/concerns addressed.  Emotional support provided. Chaplain appreciated and provided scripture reading and prayer for family.   Discussed with Dr. Maudie Mercury.   Recommendations/Plan:  No current symptoms. May be having some pain to left foot/leg. Hospice to help family manage at home.   Requires careful hand feeding. She was feeding herself prior to this admission.   Goals of Care and Additional Recommendations:  Limitations on Scope of Treatment: Avoid Hospitalization and No Artificial Feeding  Code Status:  DNR  Prognosis:   < 6 months  Discharge Planning:  Home with Hospice   Thank you for allowing the Palliative Medicine Team to assist in the care of this patient.   Total Time 60 min Prolonged Time Billed  no       Greater than 50%  of this time was spent counseling and coordinating care related to the above assessment and plan.  Vinie Sill, NP Palliative Medicine Team Pager # [phone] (M-F 8a-5p) Team Phone # [phone] (Nights/Weekends)

## 2017-09-06 LAB — CULTURE, BLOOD (ROUTINE X 2)
Culture: NO GROWTH
SPECIAL REQUESTS: ADEQUATE

## 2017-10-13 DEATH — deceased

## 2018-08-04 IMAGING — CR DG ANKLE 2V *L*
2 series · 2 of 2 positions shown · non-contrast
Comparison: None.

CLINICAL DATA: Skin ulcer on the left medial heel extending towards
the ankle.

EXAM:
LEFT ANKLE - 2 VIEW

[AP]
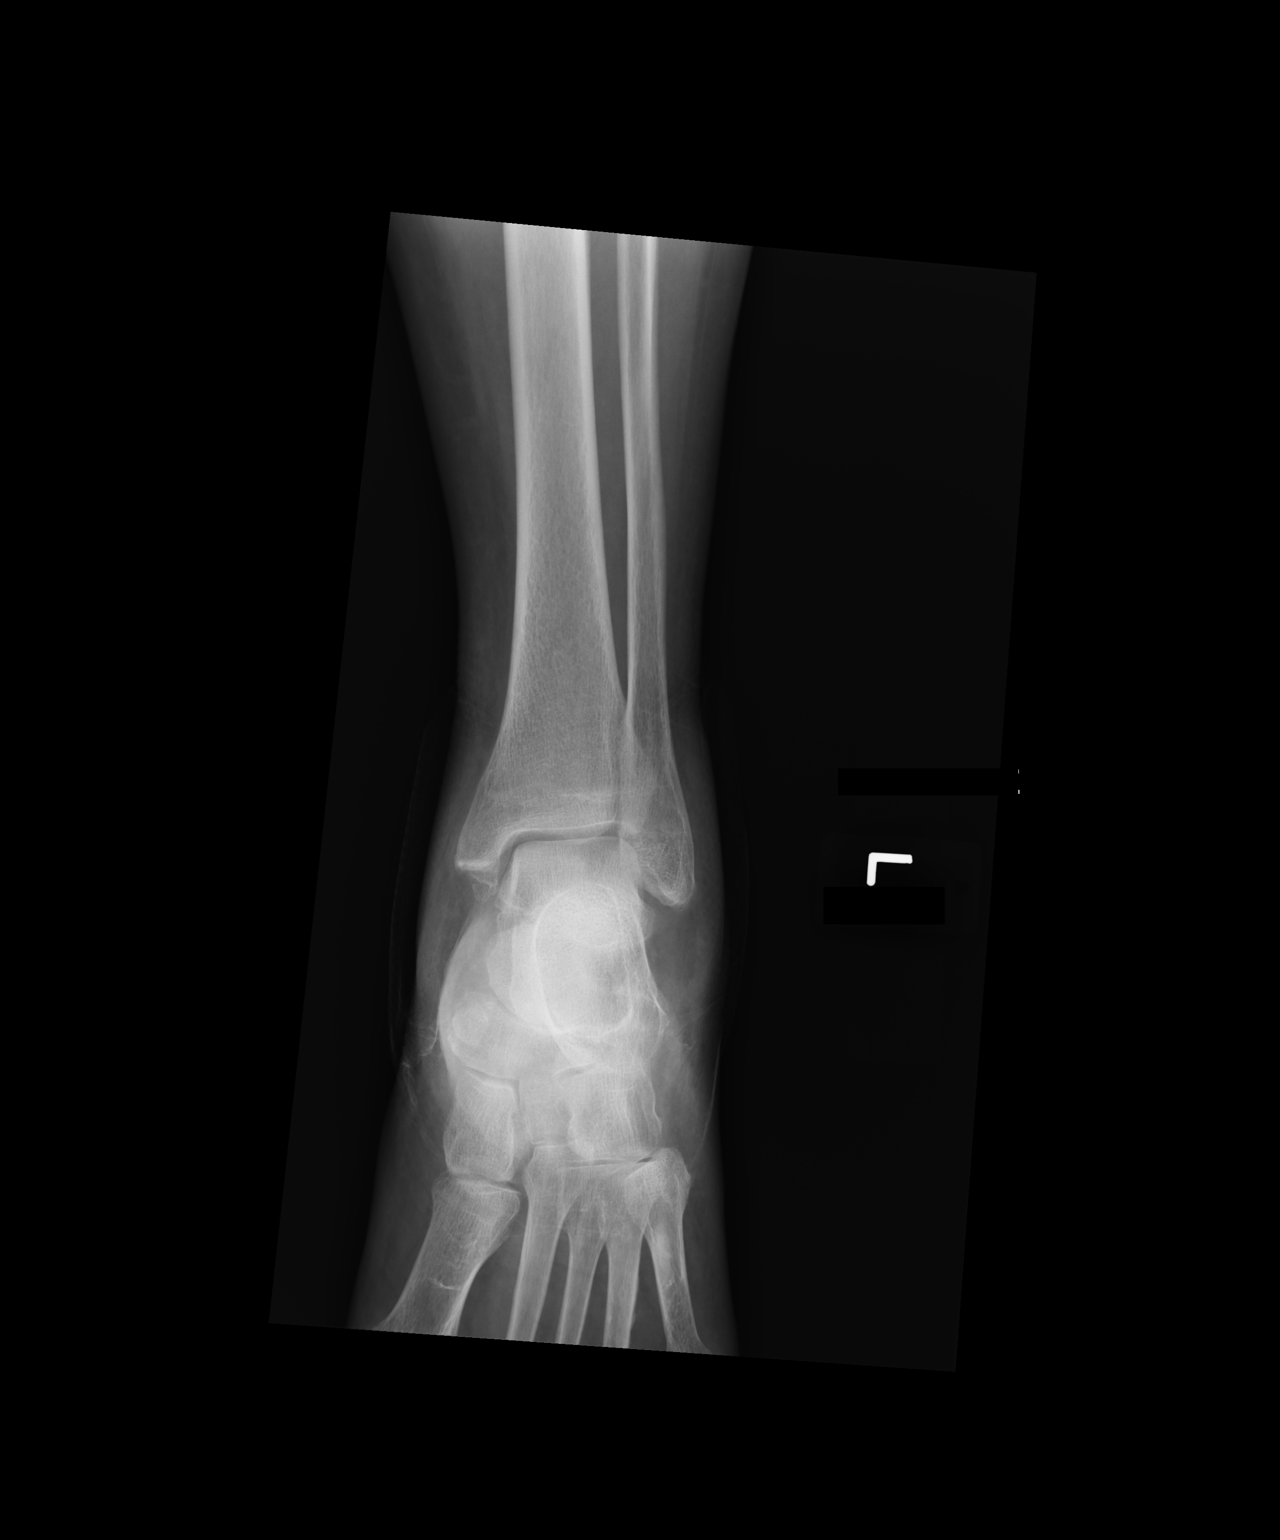

[lateral]
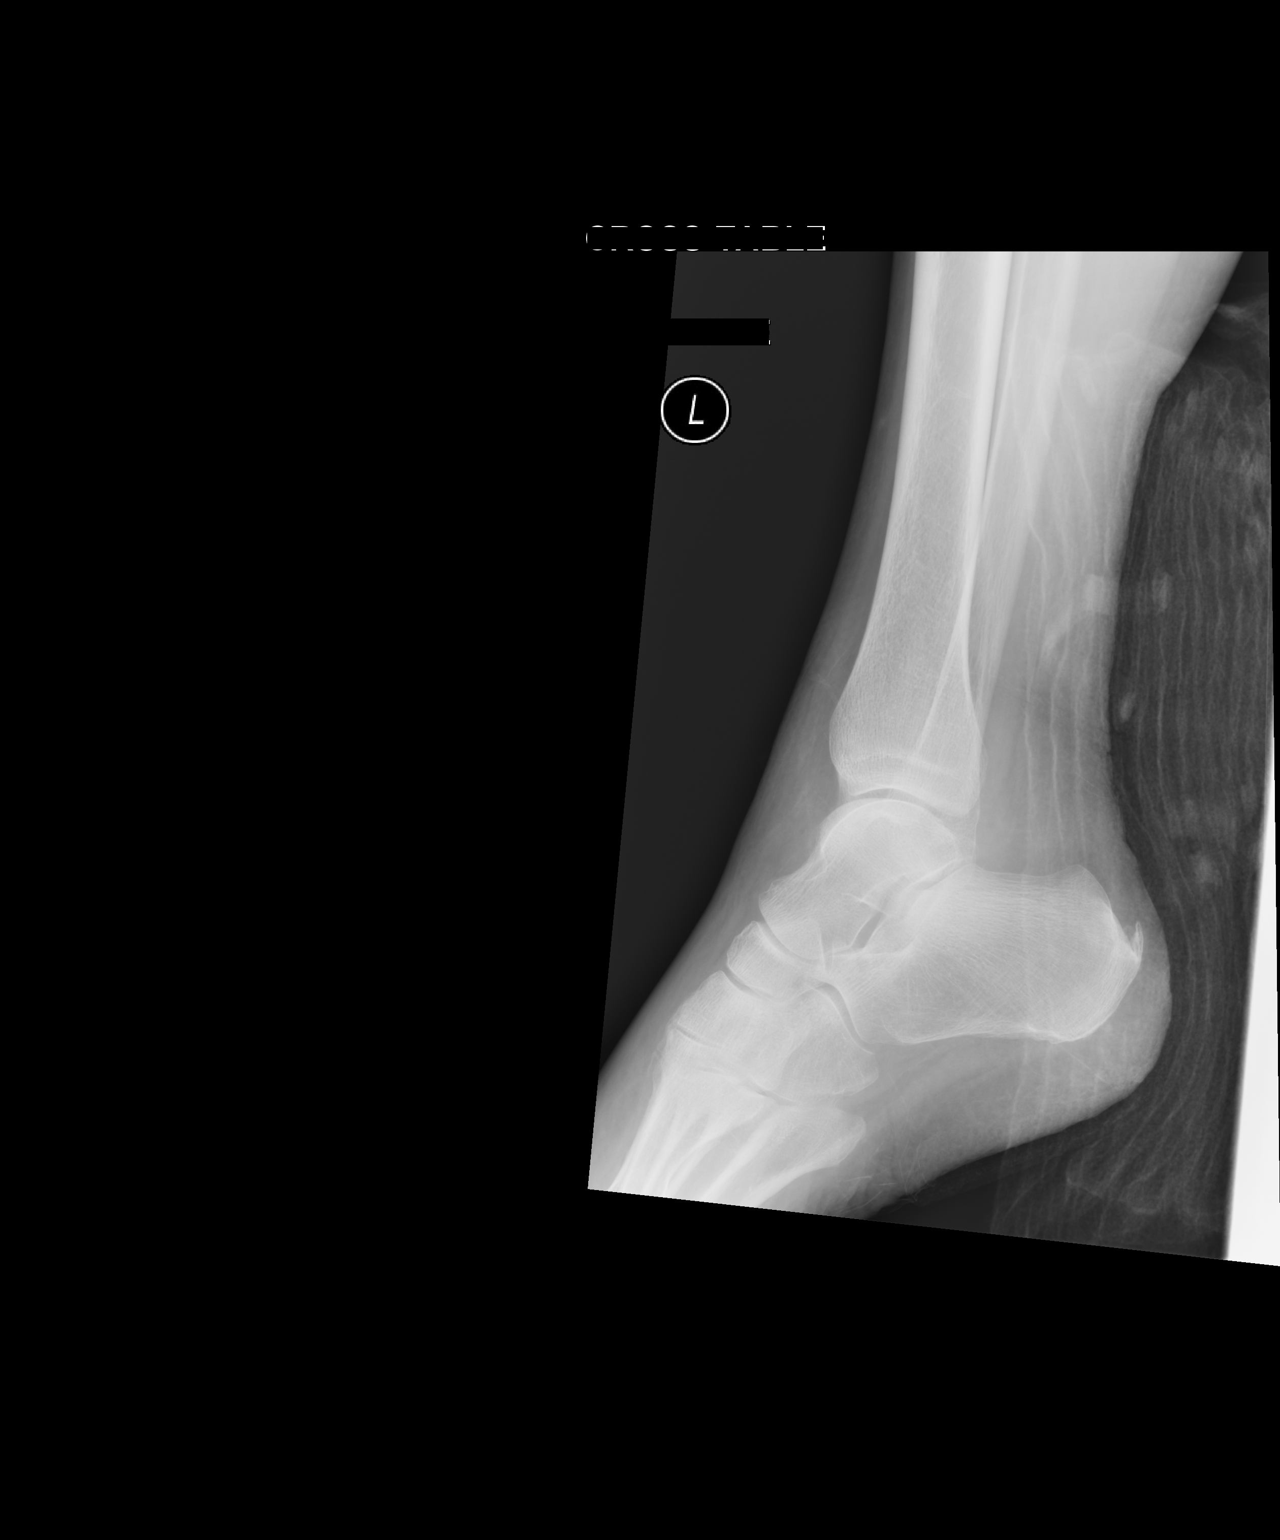

[2 of 2 positions shown; findings below may reference images not displayed]

FINDINGS: Diffuse soft tissue swelling over the left ankle. No evidence of
acute fracture or dislocation. No focal bone lesion, bone
destruction, bone sclerosis, or cortical changes. No radiopaque soft
tissue foreign bodies. Overlying extrinsic structures may obscure
small focal changes.
IMPRESSION: Soft tissue swelling about the left ankle. No acute bony
abnormalities. No radiographic changes of osteomyelitis.
